# Patient Record
Sex: Female | Born: 1985 | Race: White | Hispanic: No | Marital: Single | Smoking: Former smoker
Health system: Southern US, Community
[De-identification: ages and names within clinical notes are randomized; demographics above are authoritative.]

## PROBLEM LIST (undated history)

## (undated) DIAGNOSIS — R42 Dizziness and giddiness: Secondary | ICD-10-CM

## (undated) DIAGNOSIS — E079 Disorder of thyroid, unspecified: Secondary | ICD-10-CM

## (undated) HISTORY — DX: Disorder of thyroid, unspecified: E07.9

## (undated) HISTORY — PX: LEG SURGERY: SHX1003

---

## 2006-07-02 ENCOUNTER — Encounter: Payer: Self-pay | Admitting: Maternal & Fetal Medicine

## 2006-11-11 ENCOUNTER — Observation Stay: Payer: Self-pay | Admitting: Obstetrics and Gynecology

## 2006-11-14 ENCOUNTER — Inpatient Hospital Stay: Payer: Self-pay | Admitting: Obstetrics and Gynecology

## 2008-09-18 ENCOUNTER — Emergency Department (HOSPITAL_COMMUNITY): Admission: EM | Admit: 2008-09-18 | Discharge: 2008-09-18 | Payer: Self-pay | Admitting: Family Medicine

## 2009-02-12 ENCOUNTER — Encounter: Admission: RE | Admit: 2009-02-12 | Discharge: 2009-03-12 | Payer: Self-pay | Admitting: Orthopedic Surgery

## 2011-04-10 ENCOUNTER — Encounter: Payer: Self-pay | Admitting: Maternal & Fetal Medicine

## 2011-09-17 ENCOUNTER — Inpatient Hospital Stay: Payer: Self-pay

## 2011-09-17 LAB — CBC WITH DIFFERENTIAL/PLATELET
Basophil %: 0.2 %
Eosinophil %: 0.3 %
HGB: 12.8 g/dL (ref 12.0–16.0)
MCV: 95 fL (ref 80–100)
Monocyte %: 9.2 %
Neutrophil %: 79 %
Platelet: 210 10*3/uL (ref 150–440)
RBC: 3.78 10*6/uL — ABNORMAL LOW (ref 3.80–5.20)

## 2011-09-18 LAB — HEMOGLOBIN: HGB: 9.4 g/dL — ABNORMAL LOW (ref 12.0–16.0)

## 2016-01-16 ENCOUNTER — Emergency Department
Admission: EM | Admit: 2016-01-16 | Discharge: 2016-01-16 | Disposition: A | Discharge status: Home / Self Care | Payer: Self-pay | Attending: Emergency Medicine | Admitting: Emergency Medicine

## 2016-01-16 ENCOUNTER — Encounter: Payer: Self-pay | Admitting: Emergency Medicine

## 2016-01-16 ENCOUNTER — Emergency Department: Payer: Self-pay

## 2016-01-16 DIAGNOSIS — R42 Dizziness and giddiness: Secondary | ICD-10-CM | POA: Insufficient documentation

## 2016-01-16 DIAGNOSIS — F17200 Nicotine dependence, unspecified, uncomplicated: Secondary | ICD-10-CM | POA: Insufficient documentation

## 2016-01-16 HISTORY — DX: Dizziness and giddiness: R42

## 2016-01-16 LAB — URINALYSIS, COMPLETE (UACMP) WITH MICROSCOPIC
BILIRUBIN URINE: NEGATIVE
GLUCOSE, UA: NEGATIVE mg/dL
HGB URINE DIPSTICK: NEGATIVE
KETONES UR: NEGATIVE mg/dL
LEUKOCYTES UA: NEGATIVE
NITRITE: NEGATIVE
PH: 8 (ref 5.0–8.0)
Protein, ur: NEGATIVE mg/dL
RBC / HPF: NONE SEEN RBC/hpf (ref 0–5)
SQUAMOUS EPITHELIAL / LPF: NONE SEEN
Specific Gravity, Urine: 1.004 — ABNORMAL LOW (ref 1.005–1.030)

## 2016-01-16 LAB — BASIC METABOLIC PANEL
Anion gap: 7 (ref 5–15)
BUN: 12 mg/dL (ref 6–20)
CALCIUM: 9.6 mg/dL (ref 8.9–10.3)
CO2: 26 mmol/L (ref 22–32)
CREATININE: 0.68 mg/dL (ref 0.44–1.00)
Chloride: 105 mmol/L (ref 101–111)
Glucose, Bld: 116 mg/dL — ABNORMAL HIGH (ref 65–99)
Potassium: 3.9 mmol/L (ref 3.5–5.1)
SODIUM: 138 mmol/L (ref 135–145)

## 2016-01-16 LAB — CBC
HCT: 42.9 % (ref 35.0–47.0)
Hemoglobin: 15.3 g/dL (ref 12.0–16.0)
MCH: 33.2 pg (ref 26.0–34.0)
MCHC: 35.6 g/dL (ref 32.0–36.0)
MCV: 93.4 fL (ref 80.0–100.0)
PLATELETS: 265 10*3/uL (ref 150–440)
RBC: 4.59 MIL/uL (ref 3.80–5.20)
RDW: 12.1 % (ref 11.5–14.5)
WBC: 5.8 10*3/uL (ref 3.6–11.0)

## 2016-01-16 LAB — POCT PREGNANCY, URINE: Preg Test, Ur: NEGATIVE

## 2016-01-16 MED ORDER — DIAZEPAM 2 MG PO TABS
2.0000 mg | ORAL_TABLET | Freq: Once | ORAL | Status: AC
  Administered 2016-01-16: 2 mg via ORAL
  Filled 2016-01-16: qty 1

## 2016-01-16 MED ORDER — PROMETHAZINE HCL 12.5 MG PO TABS: 12.5000 mg | ORAL_TABLET | Freq: Four times a day (QID) | ORAL | 0 refills | Status: DC | PRN

## 2016-01-16 MED ORDER — MECLIZINE HCL 25 MG PO TABS: 25.0000 mg | ORAL_TABLET | Freq: Three times a day (TID) | ORAL | 0 refills | Status: DC | PRN

## 2016-01-16 MED ORDER — MECLIZINE HCL 25 MG PO TABS
25.0000 mg | ORAL_TABLET | Freq: Once | ORAL | Status: AC
  Administered 2016-01-16: 25 mg via ORAL
  Filled 2016-01-16: qty 1

## 2016-01-16 MED ORDER — SODIUM CHLORIDE 0.9 % IV BOLUS (SEPSIS)
1000.0000 mL | Freq: Once | INTRAVENOUS | Status: AC
  Administered 2016-01-16: 1000 mL via INTRAVENOUS

## 2016-01-16 MED ORDER — PROMETHAZINE HCL 12.5 MG RE SUPP: 12.5000 mg | Freq: Four times a day (QID) | RECTAL | 0 refills | Status: DC | PRN

## 2016-01-16 NOTE — ED Notes (Signed)
Pt reports having a hx of vertigo that started last year, pt states she does a "manuever" that has helped the dizziness before however the maneuver has not helped this current time.  Pt states she is not sure what the name of the maneuver is. Pt states when she is still the symptoms minimize however any slight movement causes increased dizziness.

## 2016-01-16 NOTE — ED Triage Notes (Signed)
Pt presents with dizziness, nausea, headche for over 1 week. States she was diagnosed with vertigo over a year ago, but states this felt different. One day over this past weekend, she experienced tinnitus for an hour or so. Pt reports being ok while she is still, but any movement gives her dizziness, nausea, headache. Pt alert & oriented. NAD noted.

## 2016-01-16 NOTE — ED Provider Notes (Signed)
-----------------------------------------   5:37 PM on 01/16/2016 -----------------------------------------   Blood pressure 122/72, pulse 68, temperature 97.8 F (36.6 C), temperature source Oral, resp. rate 16, height 5\' 5"  (1.651 m), weight 153 lb (69.4 kg), SpO2 100 %.  Assuming care from Dr. Pershing ProudSchaevitz.  In short, Anne Murphy is a 30 y.o. female with a chief complaint of Dizziness .  Refer to the original H&P for additional details.  The current plan of care is to follow patient's results and check on her from a clinical state. She states she feels slightly symptomatically improved after receiving Antivert and diazepam. The patient however states this for chest vertigo episode for her seems to be persistent and associated with a headache and some ringing in her ears which may be a form of Mnire's disease. She has not had any previous imaging was discussed the pros and cons of having a head CT. Patient agrees to a CAT scan to rule out central vertigo.  "Ct Head Wo Contrast  Result Date: 01/16/2016 CLINICAL DATA:  Vertigo for 2 weeks. EXAM: CT HEAD WITHOUT CONTRAST TECHNIQUE: Contiguous axial images were obtained from the base of the skull through the vertex without intravenous contrast. COMPARISON:  None. FINDINGS: BRAIN: The ventricles and sulci are normal. No intraparenchymal hemorrhage, mass effect nor midline shift. No acute large vascular territory infarcts. Grey-white matter distinction is maintained. The basal ganglia are unremarkable. No abnormal extra-axial fluid collections. Basal cisterns are patent. The brainstem and cerebellar hemispheres are without acute abnormalities. VASCULAR: Unremarkable. SKULL/SOFT TISSUES: No skull fracture. No significant soft tissue swelling. ORBITS/SINUSES: The included ocular globes and orbital contents are normal.The mastoid air cells are clear. The included paranasal sinuses are well-aerated. OTHER: None. IMPRESSION: No acute intracranial abnormality.  Electronically Signed   By: Tollie Ethavid  Kwon M.D.   On: 01/16/2016 16:50  " CAT scan was negative and the patient felt comfortable with discharge home and was prescribed medications.  " New Prescriptions   MECLIZINE (ANTIVERT) 25 MG TABLET    Take 1 tablet (25 mg total) by mouth 3 (three) times daily as needed for dizziness or nausea.   PROMETHAZINE (PHENERGAN) 12.5 MG SUPPOSITORY    Place 1 suppository (12.5 mg total) rectally every 6 (six) hours as needed for nausea or vomiting.   PROMETHAZINE (PHENERGAN) 12.5 MG TABLET    Take 1 tablet (12.5 mg total) by mouth every 6 (six) hours as needed for nausea or vomiting.  " She was referred to neurology unassigned and advised to return here if she develops any other focal problems and was given a school note due to the vertigo.  Patient was advised to return immediately if condition worsens. Patient was advised to follow up with their primary care physician or other specialized physicians involved in their outpatient care. The patient and/or family member/power of attorney had laboratory results reviewed at the bedside. All questions and concerns were addressed and appropriate discharge instructions were distributed by the nursing staff.     Anne MoccasinBrian S Loudon Krakow, MD 01/16/16 1739

## 2016-01-16 NOTE — ED Notes (Signed)
Patient transported to CT 

## 2016-01-16 NOTE — Discharge Instructions (Signed)
Please return immediately if condition worsens. Please contact her primary physician or the physician you were given for referral. If you have any specialist physicians involved in her treatment and plan please also contact them. Thank you for using [facility]. ° °

## 2016-01-16 NOTE — ED Provider Notes (Signed)
[facility] Emergency Department Provider Note  ____________________________________________   None    (approximate)  I have reviewed the triage vital signs and the nursing notes.   HISTORY  Chief Complaint Dizziness   HPI Anne Murphy is a 30 y.o. female with a history of vertigo who is presenting to the emergency department today with one half weeks of intermittent vertigo. She says it is worse with movement and now cannot move her head without feeling like the world is moving around her. Says that she has had intermittent ringing in her ear over the past weeks to the right ear. Says that she also had a URI about one half weeks ago. Has had previous episodes of vertigo and has been able to do "maneuvers" at home that have corrected the symptoms. However, she has been unable to correct her symptoms at home this time around and therefore came to the emergency department today. Says that she is also had mild intermittent headaches but denies any headache at this time. Says that she has been nauseous but without any vomiting.   Past Medical History:  Diagnosis Date  . Vertigo     There are no active problems to display for this patient.   History reviewed. No pertinent surgical history.  Prior to Admission medications   Not on File    Allergies Patient has no known allergies.  History reviewed. No pertinent family history.  Social History Social History  Substance Use Topics  . Smoking status: Current Every Day Smoker    Packs/day: 0.50  . Smokeless tobacco: Never Used  . Alcohol use Not on file     Comment: occasional    Review of Systems Constitutional: No fever/chills Eyes: No visual changes. ENT: No sore throat. Cardiovascular: Denies chest pain. Respiratory: Denies shortness of breath. Gastrointestinal: No abdominal pain. no vomiting.  No diarrhea.  No constipation. Genitourinary: Negative for dysuria. Musculoskeletal: Negative  for back pain. Skin: Negative for rash. Neurological: Negative for headaches, focal weakness or numbness.  10-point ROS otherwise negative.  ____________________________________________   PHYSICAL EXAM:  VITAL SIGNS: ED Triage Vitals  Enc Vitals Group     BP 01/16/16 1112 (!) 149/85     Pulse Rate 01/16/16 1112 (!) 110     Resp 01/16/16 1112 20     Temp 01/16/16 1112 97.8 F (36.6 C)     Temp Source 01/16/16 1112 Oral     SpO2 01/16/16 1112 100 %     Weight 01/16/16 1113 153 lb (69.4 kg)     Height 01/16/16 1113 5\' 5"  (1.651 m)     Head Circumference --      Peak Flow --      Pain Score --      Pain Loc --      Pain Edu? --      Excl. in GC? --     Constitutional: Alert and oriented. Well appearing and in no acute distress. Eyes: Conjunctivae are normal. PERRL. EOMI. Head: Atraumatic.Normal TMs bilaterally. Nose: No congestion/rhinnorhea. Mouth/Throat: Mucous membranes are moist.   Neck: No stridor.   Cardiovascular: Normal rate, regular rhythm. Grossly normal heart sounds.   Respiratory: Normal respiratory effort.  No retractions. Lungs CTAB. Gastrointestinal: Soft and nontender. No distention. No CVA tenderness. Musculoskeletal: No lower extremity tenderness nor edema.  No joint effusions. Neurologic:  Normal speech and language. No gross focal neurologic deficits are appreciated. No nystagmus. No ataxia on finger to nose or heel to shin testing.  Skin:  Skin is warm, dry and intact. No rash noted. Psychiatric: Mood and affect are normal. Speech and behavior are normal.  ____________________________________________   LABS (all labs ordered are listed, but only abnormal results are displayed)  Labs Reviewed  BASIC METABOLIC PANEL - Abnormal; Notable for the following:       Result Value   Glucose, Bld 116 (*)    All other components within normal limits  URINALYSIS, COMPLETE (UACMP) WITH MICROSCOPIC - Abnormal; Notable for the following:    Color, Urine STRAW  (*)    APPearance CLEAR (*)    Specific Gravity, Urine 1.004 (*)    Bacteria, UA RARE (*)    All other components within normal limits  CBC  POC URINE PREG, ED  POCT PREGNANCY, URINE  CBG MONITORING, ED   ____________________________________________  EKG   ____________________________________________  RADIOLOGY   ____________________________________________   PROCEDURES  Procedure(s) performed:   Procedures  Critical Care performed:   ____________________________________________   INITIAL IMPRESSION / ASSESSMENT AND PLAN / ED COURSE  Pertinent labs & imaging results that were available during my care of the patient were reviewed by me and considered in my medical decision making (see chart for details).   Clinical Course   ----------------------------------------- 4:15 PM on 01/16/2016 -----------------------------------------  Patient was reassessed and with mild improvement after meclizine. Decision made to start with IV fluids as well as Valium. She says that her nausea was relieved with the meclizine but still with sensation of movement of the room when she moves her head. Signed out to Dr. Huel CoteQuigley.   ____________________________________________   FINAL CLINICAL IMPRESSION(S) / ED DIAGNOSES  Vertigo.    NEW MEDICATIONS STARTED DURING THIS VISIT:  New Prescriptions   No medications on file     Note:  This document was prepared using Dragon voice recognition software and may include unintentional dictation errors.    Myrna Blazeravid Matthew Kawehi Hostetter, MD 01/16/16 [phone]

## 2018-04-20 ENCOUNTER — Other Ambulatory Visit (HOSPITAL_COMMUNITY)
Admission: RE | Admit: 2018-04-20 | Discharge: 2018-04-20 | Disposition: A | Discharge status: Home / Self Care | Payer: Medicaid Other | Source: Ambulatory Visit | Attending: Certified Nurse Midwife | Admitting: Certified Nurse Midwife

## 2018-04-20 ENCOUNTER — Encounter: Payer: Self-pay | Admitting: Certified Nurse Midwife

## 2018-04-20 ENCOUNTER — Ambulatory Visit (INDEPENDENT_AMBULATORY_CARE_PROVIDER_SITE_OTHER): Payer: Medicaid Other | Admitting: Certified Nurse Midwife

## 2018-04-20 VITALS — BP 130/71 | HR 95 | Ht 65.0 in | Wt 166.4 lb

## 2018-04-20 DIAGNOSIS — Z124 Encounter for screening for malignant neoplasm of cervix: Secondary | ICD-10-CM

## 2018-04-20 DIAGNOSIS — Z01419 Encounter for gynecological examination (general) (routine) without abnormal findings: Secondary | ICD-10-CM | POA: Diagnosis not present

## 2018-04-20 DIAGNOSIS — Z30431 Encounter for routine checking of intrauterine contraceptive device: Secondary | ICD-10-CM | POA: Diagnosis not present

## 2018-04-20 DIAGNOSIS — Z6827 Body mass index (BMI) 27.0-27.9, adult: Secondary | ICD-10-CM | POA: Diagnosis not present

## 2018-04-20 DIAGNOSIS — Z7689 Persons encountering health services in other specified circumstances: Secondary | ICD-10-CM

## 2018-04-20 DIAGNOSIS — Z0000 Encounter for general adult medical examination without abnormal findings: Secondary | ICD-10-CM

## 2018-04-20 DIAGNOSIS — F17200 Nicotine dependence, unspecified, uncomplicated: Secondary | ICD-10-CM

## 2018-04-20 DIAGNOSIS — Z975 Presence of (intrauterine) contraceptive device: Secondary | ICD-10-CM

## 2018-04-20 NOTE — Progress Notes (Signed)
ANNUAL PREVENTATIVE CARE GYN  ENCOUNTER NOTE  Subjective:       Anne Murphy is a 33 y.o. G28P2002 female here for a routine annual gynecologic exam.   Needs Pap smear and IUD string check. Denies difficulty breathing or respiratory distress, chest pain, abdominal pain, vaginal bleeding, dysuria, and leg pain or swelling.    Gynecologic History  No LMP recorded. (Menstrual status: IUD).  Contraception: IUD, Mirena  Last Pap: 2017. Results were: normal  Obstetric History  OB History  Gravida Para Term Preterm AB Living  2 2 2     2   SAB TAB Ectopic Multiple Live Births          2    # Outcome Date GA Lbr Len/2nd Weight Sex Delivery Anes PTL Lv  2 Term 2013   6 lb 4 oz (2.835 kg) M Vag-Spont   LIV  1 Term 2008   6 lb 6 oz (2.892 kg) M Vag-Spont   LIV    Past Medical History:  Diagnosis Date  . Vertigo     Past Surgical History:  Procedure Laterality Date  . LEG SURGERY      Current Outpatient Medications on File Prior to Visit  Medication Sig Dispense Refill  . levonorgestrel (MIRENA) 20 MCG/24HR IUD by Intrauterine route.    . Multiple Vitamin (MULTIVITAMIN) capsule Take 1 capsule by mouth daily.     No current facility-administered medications on file prior to visit.     No Known Allergies  Social History   Socioeconomic History  . Marital status: Single    Spouse name: Not on file  . Number of children: Not on file  . Years of education: Not on file  . Highest education level: Not on file  Occupational History  . Not on file  Social Needs  . Financial resource strain: Not on file  . Food insecurity:    Worry: Not on file    Inability: Not on file  . Transportation needs:    Medical: Not on file    Non-medical: Not on file  Tobacco Use  . Smoking status: Current Every Day Smoker    Packs/day: 0.50  . Smokeless tobacco: Never Used  Substance and Sexual Activity  . Alcohol use: Yes    Comment: occasional  . Drug use: No  . Sexual activity:  Yes    Birth control/protection: I.U.D.  Lifestyle  . Physical activity:    Days per week: Not on file    Minutes per session: Not on file  . Stress: Not on file  Relationships  . Social connections:    Talks on phone: Not on file    Gets together: Not on file    Attends religious service: Not on file    Active member of club or organization: Not on file    Attends meetings of clubs or organizations: Not on file    Relationship status: Not on file  . Intimate partner violence:    Fear of current or ex partner: Not on file    Emotionally abused: Not on file    Physically abused: Not on file    Forced sexual activity: Not on file  Other Topics Concern  . Not on file  Social History Narrative  . Not on file    Family History  Problem Relation Age of Onset  . Stroke Maternal Grandmother     The following portions of the patient's history were reviewed and updated as appropriate: allergies, current medications,  past family history, past medical history, past social history, past surgical history and problem list.  Review of Systems  ROS negative except as noted above. Information obtained from patient.    Objective:   BP 130/71   Pulse 95   Ht 5\' 5"  (1.651 m)   Wt 166 lb 6 oz (75.5 kg)   BMI 27.69 kg/m    CONSTITUTIONAL: Well-developed, well-nourished female in no acute distress.   PSYCHIATRIC: Normal mood and affect. Normal behavior. Normal judgment and thought content.  NEUROLGIC: Alert and oriented to person, place, and time. Normal muscle tone coordination. No cranial nerve deficit noted.  HENT:  Normocephalic, atraumatic, External right and left ear normal.   EYES: Conjunctivae and EOM are normal. Pupils are equal and round.  NECK: Normal range of motion, supple, no masses.  Normal thyroid.   SKIN: Skin is warm and dry. No rash noted. Not diaphoretic. No erythema. No pallor.  CARDIOVASCULAR: Normal heart rate noted, regular rhythm, no  murmur.  RESPIRATORY: Clear to auscultation bilaterally. Effort and breath sounds normal, no problems with respiration noted.  BREASTS: Symmetric in size. No masses, skin changes, nipple drainage, or lymphadenopathy.  ABDOMEN: Soft, normal bowel sounds, no distention noted.  No tenderness, rebound or guarding.   PELVIC:  External Genitalia: Normal  Vagina: Normal  Cervix: Normal, IUD strings present, Pap collected  Uterus: Normal  Adnexa: Normal  MUSCULOSKELETAL: Normal range of motion. No tenderness.  No cyanosis, clubbing, or edema.  2+ distal pulses.  LYMPHATIC: No Axillary, Supraclavicular, or Inguinal Adenopathy.  Assessment:   Annual gynecologic examination 33 y.o.   Contraception: IUD, Mirena   Overweight   Problem List Items Addressed This Visit    None    Visit Diagnoses    Well woman exam    -  Primary   Relevant Orders   CBC   Comprehensive metabolic panel   Lipid panel   Cytology - PAP   Screening for cervical cancer       Relevant Orders   Cytology - PAP   IUD check up       BMI 27.0-27.9,adult       Relevant Orders   Lipid panel   Encounter to establish care       Relevant Orders   Ambulatory referral to Family Practice   Smoker          Plan:   Pap: Pap Co Test, see orders  Labs: See orders, patient will return for fasting labs   Routine preventative health maintenance measures emphasized: Exercise/Diet/Weight control, Tobacco Warnings, Alcohol/Substance use risks and Stress Management; see AVS  Referral to [facility], see orders  Reviewed red flag symptoms and when to call  RTC x 1 year for ANNUAL EXAM or sooner if needed   Gunnar Bulla, CNM [facility], [facility] 04/20/18 11:50 AM

## 2018-04-20 NOTE — Patient Instructions (Addendum)
WE WOULD LOVE TO HEAR FROM YOU!!!!   Thank you Anne Murphy for visiting [facility].  Providing our patients with the best experience possible is really important to [place], and we hope that you felt that on your recent visit. The most valuable feedback we get comes from [place]!!    If you receive a survey please take a couple of minutes to let us know how we did.Thank you for continuing to trust [place] with your care.   [facility]   Levonorgestrel intrauterine device (IUD) What is this medicine? LEVONORGESTREL IUD (LEE voe nor jes trel) is a contraceptive (birth control) device. The device is placed inside the uterus by a healthcare professional. It is used to prevent pregnancy. This device can also be used to treat heavy bleeding that occurs during your period. This medicine may be used for other purposes; ask your health care provider or pharmacist if you have questions. COMMON BRAND NAME(S): Minette Headland What should I tell my health care provider before I take this medicine? They need to know if you have any of these conditions: -abnormal Pap smear -cancer of the breast, uterus, or cervix -diabetes -endometritis -genital or pelvic infection now or in the past -have more than one sexual partner or your partner has more than one partner -heart disease -history of an ectopic or tubal pregnancy -immune system problems -IUD in place -liver disease or tumor -problems with blood clots or take blood-thinners -seizures -use intravenous drugs -uterus of unusual shape -vaginal bleeding that has not been explained -an unusual or allergic reaction to levonorgestrel, other hormones, silicone, or polyethylene, medicines, foods, dyes, or preservatives -pregnant or trying to get pregnant -breast-feeding How should I use this medicine? This device is placed inside the uterus by a health care professional. Talk to your pediatrician  regarding the use of this medicine in children. Special care may be needed. Overdosage: If you think you have taken too much of this medicine contact a poison control center or emergency room at once. NOTE: This medicine is only for you. Do not share this medicine with others. What if I miss a dose? This does not apply. Depending on the brand of device you have inserted, the device will need to be replaced every 3 to 5 years if you wish to continue using this type of birth control. What may interact with this medicine? Do not take this medicine with any of the following medications: -amprenavir -bosentan -fosamprenavir This medicine may also interact with the following medications: -aprepitant -armodafinil -barbiturate medicines for inducing sleep or treating seizures -bexarotene -boceprevir -griseofulvin -medicines to treat seizures like carbamazepine, ethotoin, felbamate, oxcarbazepine, phenytoin, topiramate -modafinil -pioglitazone -rifabutin -rifampin -rifapentine -some medicines to treat HIV infection like atazanavir, efavirenz, indinavir, lopinavir, nelfinavir, tipranavir, ritonavir -St. John's wort -warfarin This list may not describe all possible interactions. Give your health care provider a list of all the medicines, herbs, non-prescription drugs, or dietary supplements you use. Also tell them if you smoke, drink alcohol, or use illegal drugs. Some items may interact with your medicine. What should I watch for while using this medicine? Visit your doctor or health care professional for regular check ups. See your doctor if you or your partner has sexual contact with others, becomes HIV positive, or gets a sexual transmitted disease. This product does not protect you against HIV infection (AIDS) or other sexually transmitted diseases. You can check the placement of the IUD yourself by reaching up to the top  of your vagina with clean fingers to feel the threads. Do not pull on  the threads. It is a good habit to check placement after each menstrual period. Call your doctor right away if you feel more of the IUD than just the threads or if you cannot feel the threads at all. The IUD may come out by itself. You may become pregnant if the device comes out. If you notice that the IUD has come out use a backup birth control method like condoms and call your health care provider. Using tampons will not change the position of the IUD and are okay to use during your period. This IUD can be safely scanned with magnetic resonance imaging (MRI) only under specific conditions. Before you have an MRI, tell your healthcare provider that you have an IUD in place, and which type of IUD you have in place. What side effects may I notice from receiving this medicine? Side effects that you should report to your doctor or health care professional as soon as possible: -allergic reactions like skin rash, itching or hives, swelling of the face, lips, or tongue -fever, flu-like symptoms -genital sores -high blood pressure -no menstrual period for 6 weeks during use -pain, swelling, warmth in the leg -pelvic pain or tenderness -severe or sudden headache -signs of pregnancy -stomach cramping -sudden shortness of breath -trouble with balance, talking, or walking -unusual vaginal bleeding, discharge -yellowing of the eyes or skin Side effects that usually do not require medical attention (report to your doctor or health care professional if they continue or are bothersome): -acne -breast pain -change in sex drive or performance -changes in weight -cramping, dizziness, or faintness while the device is being inserted -headache -irregular menstrual bleeding within first 3 to 6 months of use -nausea This list may not describe all possible side effects. Call your doctor for medical advice about side effects. You may report side effects to FDA at 1-800-FDA-1088. Where should I keep my  medicine? This does not apply. NOTE: This sheet is a summary. It may not cover all possible information. If you have questions about this medicine, talk to your doctor, pharmacist, or health care provider.  2019 Elsevier/Gold Standard (2015-11-09 14:14:56)   Preventive Care 18-39 Years, Female Preventive care refers to lifestyle choices and visits with your health care provider that can promote health and wellness. What does preventive care include?   A yearly physical exam. This is also called an annual well check.  Dental exams once or twice a year.  Routine eye exams. Ask your health care provider how often you should have your eyes checked.  Personal lifestyle choices, including: ? Daily care of your teeth and gums. ? Regular physical activity. ? Eating a healthy diet. ? Avoiding tobacco and drug use. ? Limiting alcohol use. ? Practicing safe sex. ? Taking vitamin and mineral supplements as recommended by your health care provider. What happens during an annual well check? The services and screenings done by your health care provider during your annual well check will depend on your age, overall health, lifestyle risk factors, and family history of disease. Counseling Your health care provider may ask you questions about your:  Alcohol use.  Tobacco use.  Drug use.  Emotional well-being.  Home and relationship well-being.  Sexual activity.  Eating habits.  Work and work Statistician.  Method of birth control.  Menstrual cycle.  Pregnancy history. Screening You may have the following tests or measurements:  Height, weight, and BMI.  Diabetes screening. This is done by checking your blood sugar (glucose) after you have not eaten for a while (fasting).  Blood pressure.  Lipid and cholesterol levels. These may be checked every 5 years starting at age 107.  Skin check.  Hepatitis C blood test.  Hepatitis B blood test.  Sexually transmitted disease  (STD) testing.  BRCA-related cancer screening. This may be done if you have a family history of breast, ovarian, tubal, or peritoneal cancers.  Pelvic exam and Pap test. This may be done every 3 years starting at age 43. Starting at age 66, this may be done every 5 years if you have a Pap test in combination with an HPV test. Discuss your test results, treatment options, and if necessary, the need for more tests with your health care provider. Vaccines Your health care provider may recommend certain vaccines, such as:  Influenza vaccine. This is recommended every year.  Tetanus, diphtheria, and acellular pertussis (Tdap, Td) vaccine. You may need a Td booster every 10 years.  Varicella vaccine. You may need this if you have not been vaccinated.  HPV vaccine. If you are 76 or younger, you may need three doses over 6 months.  Measles, mumps, and rubella (MMR) vaccine. You may need at least one dose of MMR. You may also need a second dose.  Pneumococcal 13-valent conjugate (PCV13) vaccine. You may need this if you have certain conditions and were not previously vaccinated.  Pneumococcal polysaccharide (PPSV23) vaccine. You may need one or two doses if you smoke cigarettes or if you have certain conditions.  Meningococcal vaccine. One dose is recommended if you are age 72-21 years and a first-year college student living in a residence hall, or if you have one of several medical conditions. You may also need additional booster doses.  Hepatitis A vaccine. You may need this if you have certain conditions or if you travel or work in places where you may be exposed to hepatitis A.  Hepatitis B vaccine. You may need this if you have certain conditions or if you travel or work in places where you may be exposed to hepatitis B.  Haemophilus influenzae type b (Hib) vaccine. You may need this if you have certain risk factors. Talk to your health care provider about which screenings and vaccines you  need and how often you need them. This information is not intended to replace advice given to you by your health care provider. Make sure you discuss any questions you have with your health care provider. Document Released: 03/25/2001 Document Revised: 09/09/2016 Document Reviewed: 11/28/2014 Elsevier Interactive Patient Education  2019 [employer].

## 2018-04-21 ENCOUNTER — Other Ambulatory Visit: Payer: Medicaid Other

## 2018-04-21 ENCOUNTER — Other Ambulatory Visit: Payer: Self-pay

## 2018-04-21 DIAGNOSIS — Z01419 Encounter for gynecological examination (general) (routine) without abnormal findings: Secondary | ICD-10-CM | POA: Diagnosis not present

## 2018-04-21 DIAGNOSIS — Z6827 Body mass index (BMI) 27.0-27.9, adult: Secondary | ICD-10-CM | POA: Diagnosis not present

## 2018-04-22 LAB — COMPREHENSIVE METABOLIC PANEL
A/G RATIO: 2.5 — AB (ref 1.2–2.2)
ALK PHOS: 49 IU/L (ref 39–117)
ALT: 14 IU/L (ref 0–32)
AST: 13 IU/L (ref 0–40)
Albumin: 4.5 g/dL (ref 3.8–4.8)
BUN/Creatinine Ratio: 14 (ref 9–23)
BUN: 9 mg/dL (ref 6–20)
Bilirubin Total: 0.3 mg/dL (ref 0.0–1.2)
CO2: 22 mmol/L (ref 20–29)
CREATININE: 0.66 mg/dL (ref 0.57–1.00)
Calcium: 9.3 mg/dL (ref 8.7–10.2)
Chloride: 105 mmol/L (ref 96–106)
GFR calc Af Amer: 135 mL/min/{1.73_m2} (ref >59)
GFR calc non Af Amer: 117 mL/min/{1.73_m2} (ref >59)
GLOBULIN, TOTAL: 1.8 g/dL (ref 1.5–4.5)
Glucose: 87 mg/dL (ref 65–99)
POTASSIUM: 4.3 mmol/L (ref 3.5–5.2)
SODIUM: 140 mmol/L (ref 134–144)
Total Protein: 6.3 g/dL (ref 6.0–8.5)

## 2018-04-22 LAB — CBC
HEMATOCRIT: 38.4 % (ref 34.0–46.6)
Hemoglobin: 13.5 g/dL (ref 11.1–15.9)
MCH: 32.7 pg (ref 26.6–33.0)
MCHC: 35.2 g/dL (ref 31.5–35.7)
MCV: 93 fL (ref 79–97)
Platelets: 274 10*3/uL (ref 150–450)
RBC: 4.13 x10E6/uL (ref 3.77–5.28)
RDW: 11.6 % — AB (ref 11.7–15.4)
WBC: 5.1 10*3/uL (ref 3.4–10.8)

## 2018-04-22 LAB — LIPID PANEL
CHOLESTEROL TOTAL: 119 mg/dL (ref 100–199)
Chol/HDL Ratio: 2.2 ratio (ref 0.0–4.4)
HDL: 54 mg/dL (ref >39)
LDL Calculated: 58 mg/dL (ref 0–99)
TRIGLYCERIDES: 34 mg/dL (ref 0–149)
VLDL Cholesterol Cal: 7 mg/dL (ref 5–40)

## 2018-04-23 LAB — CYTOLOGY - PAP
Diagnosis: NEGATIVE
HPV: NOT DETECTED

## 2018-05-18 IMAGING — CT CT HEAD W/O CM
3 series · 16 of 46 positions shown, 19 images · non-contrast
Comparison: None.

CLINICAL DATA: Vertigo for 2 weeks.

EXAM:
CT HEAD WITHOUT CONTRAST
TECHNIQUE: Contiguous axial images were obtained from the base of the skull
through the vertex without intravenous contrast.

[Series 2: head wo · axial · 0.40mm/px · z∈[-271,-151]mm · 10 of 29 slices shown, 13 images]
[im 3/29  brain]
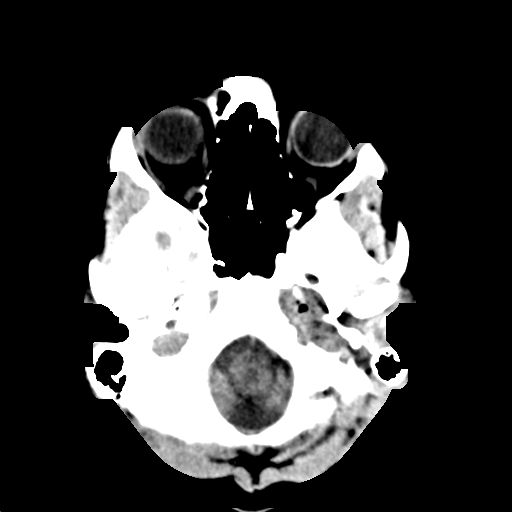
[im 3/29  bone]
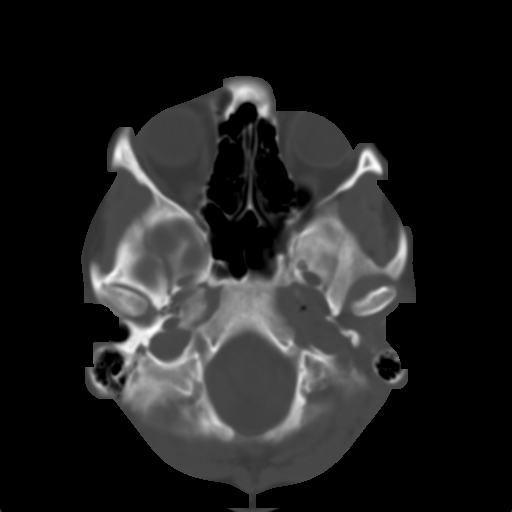
[im 6/29  brain]
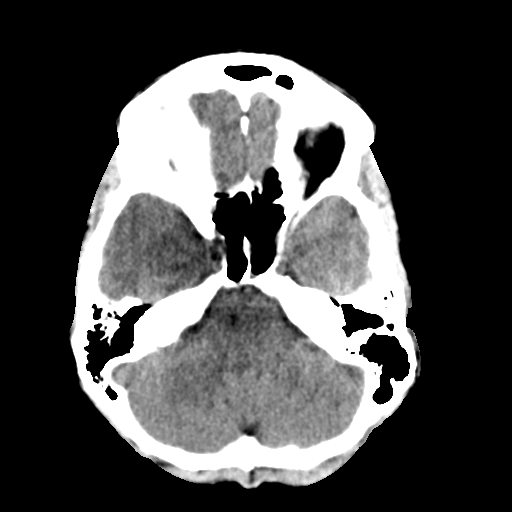
[im 8/29  brain]
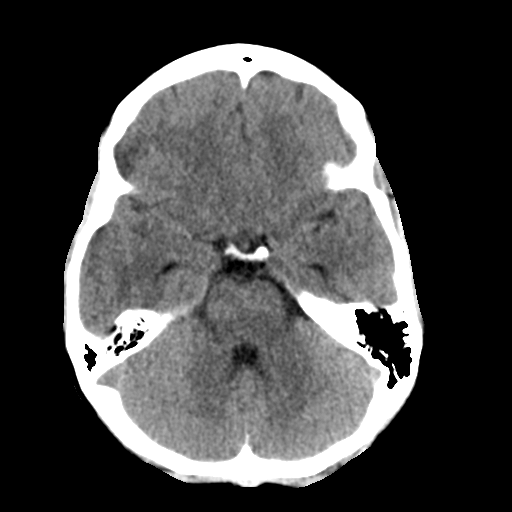
[im 11/29  brain]
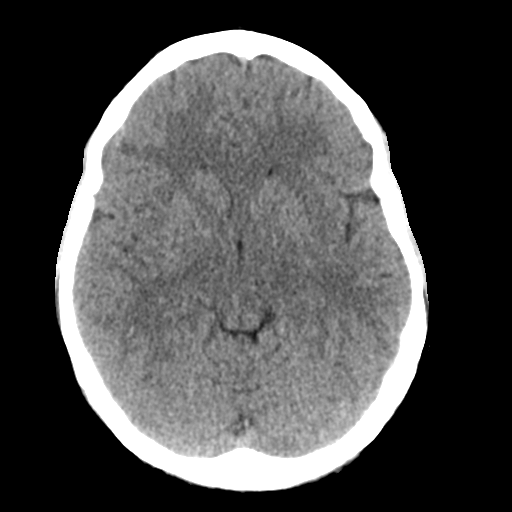
[im 14/29  brain]
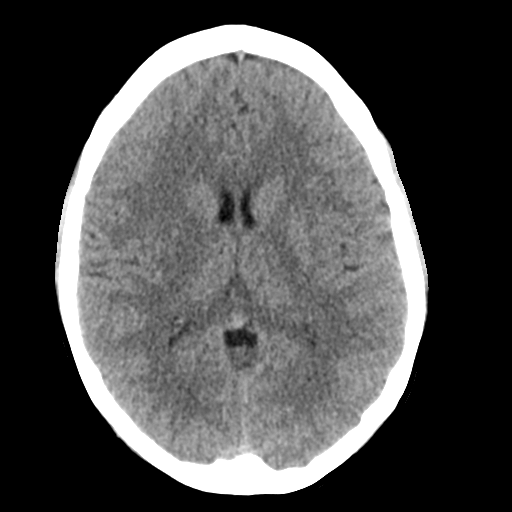
[im 14/29  bone]
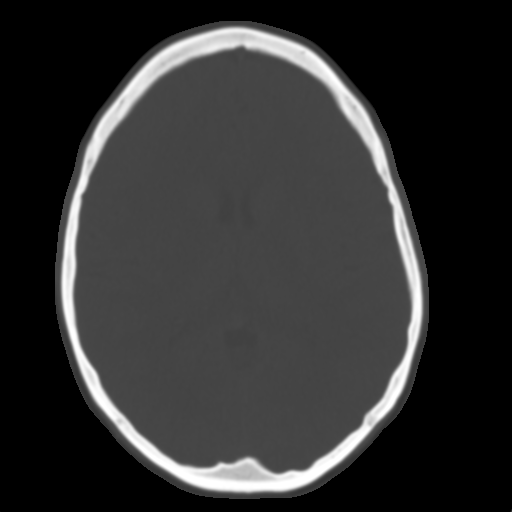
[im 16/29  brain]
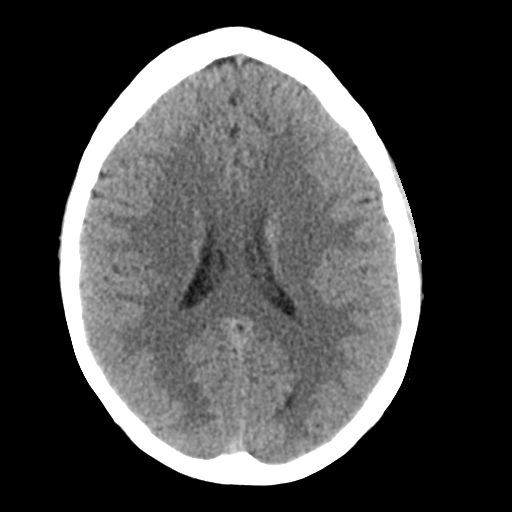
[im 19/29  brain]
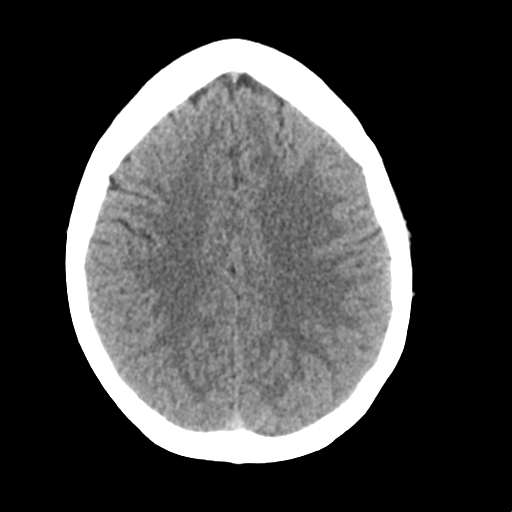
[im 22/29  brain]
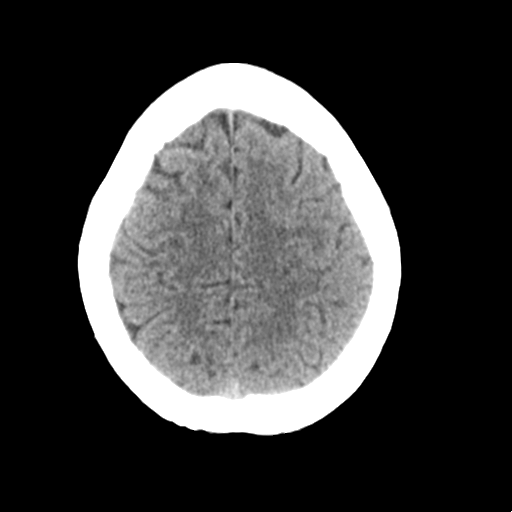
[im 24/29  brain]
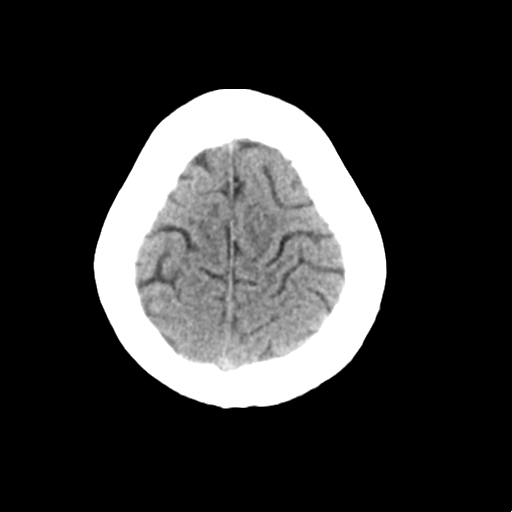
[im 24/29  bone]
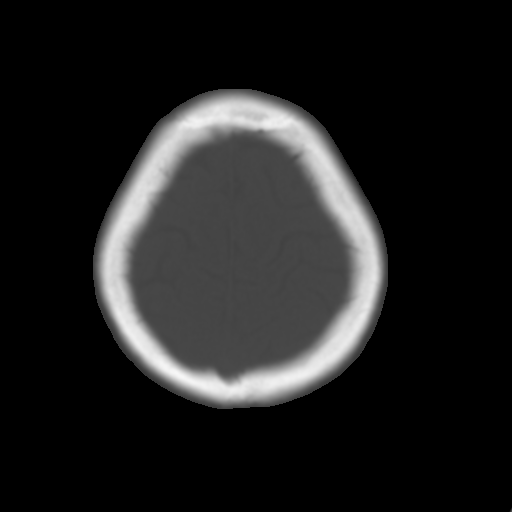
[im 27/29  brain]
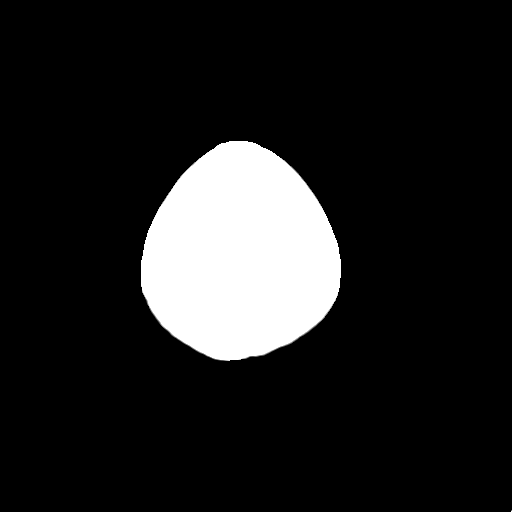

[Series 4: coronal soft tissue · coronal · 0.30mm/px · 3 of 64 slices shown]
[im 22/64  brain]
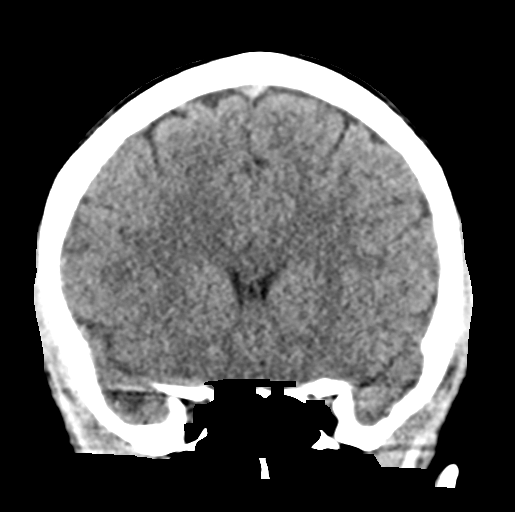
[im 29/64  brain]
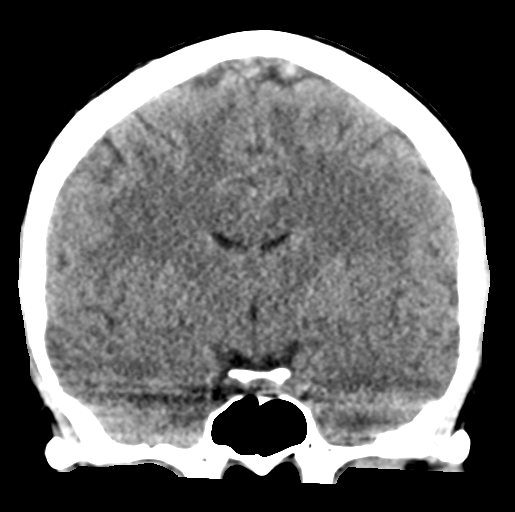
[im 36/64  brain]
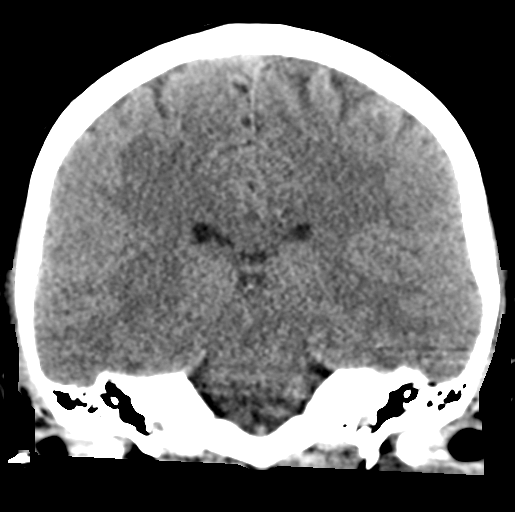

[Series 5: sagittal soft tissue · sagittal · 0.30mm/px · 3 of 52 slices shown]
[im 18/52  brain]
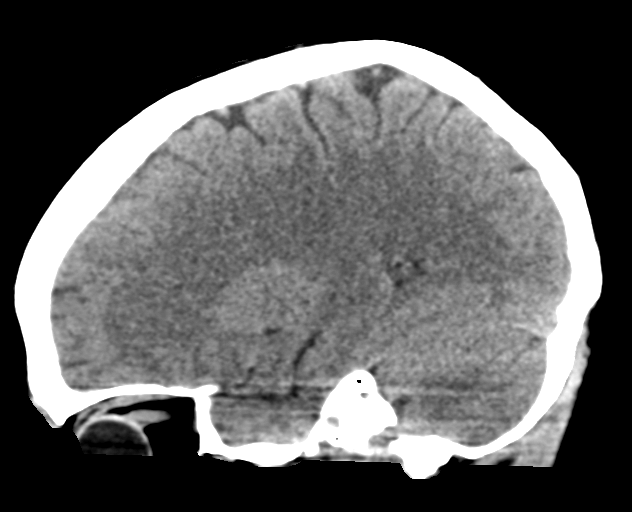
[im 26/52  brain]
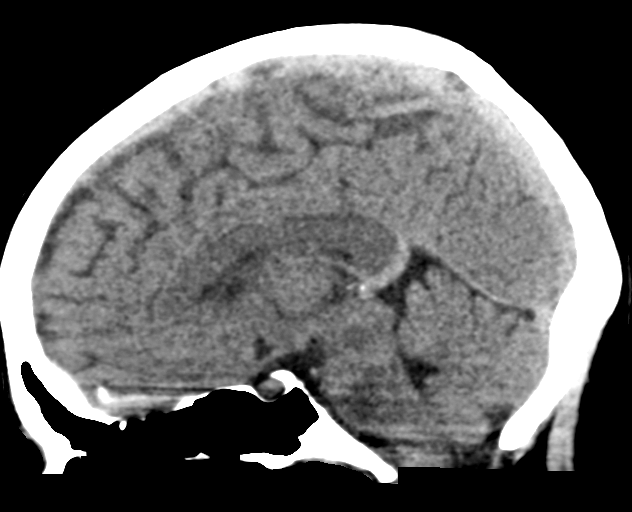
[im 35/52  brain]
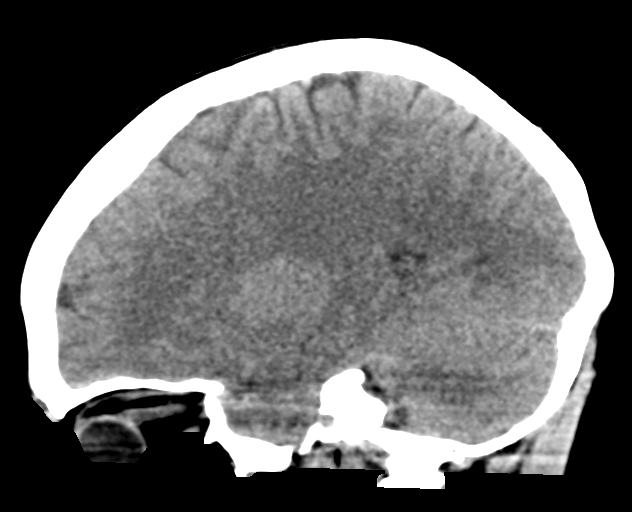

[16 of 46 positions shown; findings below may reference images not displayed]

FINDINGS: BRAIN: The ventricles and sulci are normal. No intraparenchymal
hemorrhage, mass effect nor midline shift. No acute large vascular
territory infarcts. Grey-white matter distinction is maintained. The
basal ganglia are unremarkable. No abnormal extra-axial fluid
collections. Basal cisterns are patent. The brainstem and cerebellar
hemispheres are without acute abnormalities.

VASCULAR: Unremarkable.

SKULL/SOFT TISSUES: No skull fracture. No significant soft tissue
swelling.

ORBITS/SINUSES: The included ocular globes and orbital contents are
normal.The mastoid air cells are clear. The included paranasal
sinuses are well-aerated.

OTHER: None.
IMPRESSION: No acute intracranial abnormality.

## 2018-12-09 DIAGNOSIS — Z23 Encounter for immunization: Secondary | ICD-10-CM | POA: Diagnosis not present

## 2019-04-04 DIAGNOSIS — Z20828 Contact with and (suspected) exposure to other viral communicable diseases: Secondary | ICD-10-CM | POA: Diagnosis not present

## 2019-04-14 ENCOUNTER — Encounter: Payer: Self-pay | Admitting: Physician Assistant

## 2019-04-14 ENCOUNTER — Ambulatory Visit (INDEPENDENT_AMBULATORY_CARE_PROVIDER_SITE_OTHER): Payer: Medicaid Other | Admitting: Physician Assistant

## 2019-04-14 VITALS — BP 129/88 | HR 92 | Temp 97.3°F | Ht 66.0 in | Wt 175.0 lb

## 2019-04-14 DIAGNOSIS — Z6828 Body mass index (BMI) 28.0-28.9, adult: Secondary | ICD-10-CM | POA: Diagnosis not present

## 2019-04-14 DIAGNOSIS — K5904 Chronic idiopathic constipation: Secondary | ICD-10-CM

## 2019-04-14 DIAGNOSIS — Z13220 Encounter for screening for lipoid disorders: Secondary | ICD-10-CM

## 2019-04-14 DIAGNOSIS — N898 Other specified noninflammatory disorders of vagina: Secondary | ICD-10-CM

## 2019-04-14 DIAGNOSIS — R635 Abnormal weight gain: Secondary | ICD-10-CM

## 2019-04-14 DIAGNOSIS — G44209 Tension-type headache, unspecified, not intractable: Secondary | ICD-10-CM

## 2019-04-14 DIAGNOSIS — Z131 Encounter for screening for diabetes mellitus: Secondary | ICD-10-CM

## 2019-04-14 DIAGNOSIS — Z0000 Encounter for general adult medical examination without abnormal findings: Secondary | ICD-10-CM

## 2019-04-14 NOTE — Patient Instructions (Signed)
Health Maintenance, Female Adopting a healthy lifestyle and getting preventive care are important in promoting health and wellness. Ask your health care provider about:  The right schedule for you to have regular tests and exams.  Things you can do on your own to prevent diseases and keep yourself healthy. What should I know about diet, weight, and exercise? Eat a healthy diet   Eat a diet that includes plenty of vegetables, fruits, low-fat dairy products, and lean protein.  Do not eat a lot of foods that are high in solid fats, added sugars, or sodium. Maintain a healthy weight Body mass index (BMI) is used to identify weight problems. It estimates body fat based on height and weight. Your health care provider can help determine your BMI and help you achieve or maintain a healthy weight. Get regular exercise Get regular exercise. This is one of the most important things you can do for your health. Most adults should:  Exercise for at least 150 minutes each week. The exercise should increase your heart rate and make you sweat (moderate-intensity exercise).  Do strengthening exercises at least twice a week. This is in addition to the moderate-intensity exercise.  Spend less time sitting. Even light physical activity can be beneficial. Watch cholesterol and blood lipids Have your blood tested for lipids and cholesterol at 34 years of age, then have this test every 5 years. Have your cholesterol levels checked more often if:  Your lipid or cholesterol levels are high.  You are older than 34 years of age.  You are at high risk for heart disease. What should I know about cancer screening? Depending on your health history and family history, you may need to have cancer screening at various ages. This may include screening for:  Breast cancer.  Cervical cancer.  Colorectal cancer.  Skin cancer.  Lung cancer. What should I know about heart disease, diabetes, and high blood  pressure? Blood pressure and heart disease  High blood pressure causes heart disease and increases the risk of stroke. This is more likely to develop in people who have high blood pressure readings, are of African descent, or are overweight.  Have your blood pressure checked: ? Every 3-5 years if you are 18-39 years of age. ? Every year if you are 40 years old or older. Diabetes Have regular diabetes screenings. This checks your fasting blood sugar level. Have the screening done:  Once every three years after age 40 if you are at a normal weight and have a low risk for diabetes.  More often and at a younger age if you are overweight or have a high risk for diabetes. What should I know about preventing infection? Hepatitis B If you have a higher risk for hepatitis B, you should be screened for this virus. Talk with your health care provider to find out if you are at risk for hepatitis B infection. Hepatitis C Testing is recommended for:  Everyone born from 1945 through 1965.  Anyone with known risk factors for hepatitis C. Sexually transmitted infections (STIs)  Get screened for STIs, including gonorrhea and chlamydia, if: ? You are sexually active and are younger than 34 years of age. ? You are older than 34 years of age and your health care provider tells you that you are at risk for this type of infection. ? Your sexual activity has changed since you were last screened, and you are at increased risk for chlamydia or gonorrhea. Ask your health care provider if   you are at risk.  Ask your health care provider about whether you are at high risk for HIV. Your health care provider may recommend a prescription medicine to help prevent HIV infection. If you choose to take medicine to prevent HIV, you should first get tested for HIV. You should then be tested every 3 months for as long as you are taking the medicine. Pregnancy  If you are about to stop having your period (premenopausal) and  you may become pregnant, seek counseling before you get pregnant.  Take 400 to 800 micrograms (mcg) of folic acid every day if you become pregnant.  Ask for birth control (contraception) if you want to prevent pregnancy. Osteoporosis and menopause Osteoporosis is a disease in which the bones lose minerals and strength with aging. This can result in bone fractures. If you are 65 years old or older, or if you are at risk for osteoporosis and fractures, ask your health care provider if you should:  Be screened for bone loss.  Take a calcium or vitamin D supplement to lower your risk of fractures.  Be given hormone replacement therapy (HRT) to treat symptoms of menopause. Follow these instructions at home: Lifestyle  Do not use any products that contain nicotine or tobacco, such as cigarettes, e-cigarettes, and chewing tobacco. If you need help quitting, ask your health care provider.  Do not use street drugs.  Do not share needles.  Ask your health care provider for help if you need support or information about quitting drugs. Alcohol use  Do not drink alcohol if: ? Your health care provider tells you not to drink. ? You are pregnant, may be pregnant, or are planning to become pregnant.  If you drink alcohol: ? Limit how much you use to 0-1 drink a day. ? Limit intake if you are breastfeeding.  Be aware of how much alcohol is in your drink. In the [place] one drink equals one 12 oz bottle of beer (355 mL), one 5 oz glass of wine (148 mL), or one 1 oz glass of hard liquor (44 mL). General instructions  Schedule regular health, dental, and eye exams.  Stay current with your vaccines.  Tell your health care provider if: ? You often feel depressed. ? You have ever been abused or do not feel safe at home. Summary  Adopting a healthy lifestyle and getting preventive care are important in promoting health and wellness.  Follow your health care provider's instructions about healthy  diet, exercising, and getting tested or screened for diseases.  Follow your health care provider's instructions on monitoring your cholesterol and blood pressure. This information is not intended to replace advice given to you by your health care provider. Make sure you discuss any questions you have with your health care provider. Document Revised: 01/20/2018 Document Reviewed: 01/20/2018 Elsevier Patient Education  2020 [employer]  

## 2019-04-14 NOTE — Progress Notes (Signed)
Patient: Anne Murphy, Female    DOB: [birth year], 34 y.o.   MRN: [id number] Visit Date: 04/14/2019  Today's Provider: Mar Daring, PA-C   Chief Complaint  Patient presents with  . Establish Care   Subjective:     Establish Care Anne Murphy is a 34 y.o. female who presents today to establish care.  She feels well. She reports exercising . She reports she is sleeping well.  Currently in a committed relationship with children. She has taken classes at [employer] and has applied to [employer] [place] but is currently on the waitlist.  -----------------------------------------------------------------   Review of Systems  Constitutional: Negative.   HENT: Negative.   Eyes: Negative.   Respiratory: Negative.   Cardiovascular: Negative.   Gastrointestinal: Positive for abdominal distention and constipation.  Endocrine: Negative.   Genitourinary: Positive for vaginal discharge.  Musculoskeletal: Negative.   Allergic/Immunologic: Negative.   Neurological: Positive for headaches.  Hematological: Negative.   Psychiatric/Behavioral: Negative.     Social History      She  reports that she quit smoking about 2 months ago. She smoked 0.50 packs per day. She has never used smokeless tobacco. She reports current alcohol use. She reports that she does not use drugs.       Social History   Socioeconomic History  . Marital status: Single    Spouse name: Not on file  . Number of children: Not on file  . Years of education: Not on file  . Highest education level: Not on file  Occupational History  . Not on file  Tobacco Use  . Smoking status: Former Smoker    Packs/day: 0.50    Quit date: 02/11/2019    Years since quitting: 0.1  . Smokeless tobacco: Never Used  Substance and Sexual Activity  . Alcohol use: Yes    Comment: occasional  . Drug use: No  . Sexual activity: Yes    Birth control/protection: I.U.D.  Other Topics Concern  . Not on file  Social History  Narrative  . Not on file   Social Determinants of Health   Financial Resource Strain:   . Difficulty of Paying Living Expenses: Not on file  Food Insecurity:   . Worried About Charity fundraiser in the Last Year: Not on file  . Ran Out of Food in the Last Year: Not on file  Transportation Needs:   . Lack of Transportation (Medical): Not on file  . Lack of Transportation (Non-Medical): Not on file  Physical Activity:   . Days of Exercise per Week: Not on file  . Minutes of Exercise per Session: Not on file  Stress:   . Feeling of Stress : Not on file  Social Connections:   . Frequency of Communication with Friends and Family: Not on file  . Frequency of Social Gatherings with Friends and Family: Not on file  . Attends Religious Services: Not on file  . Active Member of Clubs or Organizations: Not on file  . Attends Archivist Meetings: Not on file  . Marital Status: Not on file    Past Medical History:  Diagnosis Date  . Vertigo      Patient Active Problem List   Diagnosis Date Noted  . IUD (intrauterine device) in place 04/20/2018    Past Surgical History:  Procedure Laterality Date  . LEG SURGERY      Family History        Family Status  Relation Name Status  . MGM  Alive  . Mother  Alive  . Brother  Alive  . MGF  Alive        Her family history includes Healthy in her brother and mother; Stroke in her maternal grandmother.      No Known Allergies   Current Outpatient Medications:  .  levonorgestrel (MIRENA) 20 MCG/24HR IUD, by Intrauterine route., Disp: , Rfl:  .  Multiple Vitamin (MULTIVITAMIN) capsule, Take 1 capsule by mouth daily., Disp: , Rfl:    Patient Care Team: Patient, No Pcp Per as PCP - General (General Practice)    Objective:    Vitals: BP 129/88 (BP Location: Right Arm, Patient Position: Sitting, Cuff Size: Normal)   Pulse 92   Temp (!) 97.3 F (36.3 C) (Temporal)   Ht 5\' 6"  (1.676 m)   Wt 175 lb (79.4 kg)   BMI  28.25 kg/m    Vitals:   04/14/19 1423  BP: 129/88  Pulse: 92  Temp: (!) 97.3 F (36.3 C)  TempSrc: Temporal  Weight: 175 lb (79.4 kg)  Height: 5\' 6"  (1.676 m)     Physical Exam Vitals reviewed.  Constitutional:      General: She is not in acute distress.    Appearance: Normal appearance. She is well-developed and overweight. She is not diaphoretic.  HENT:     Head: Normocephalic and atraumatic.     Right Ear: Ear canal and external ear normal.     Left Ear: Tympanic membrane, ear canal and external ear normal.  Eyes:     General: No scleral icterus.       Right eye: No discharge.        Left eye: No discharge.     Extraocular Movements: Extraocular movements intact.     Conjunctiva/sclera: Conjunctivae normal.     Pupils: Pupils are equal, round, and reactive to light.  Neck:     Thyroid: No thyromegaly.     Vascular: No JVD.     Trachea: No tracheal deviation.  Cardiovascular:     Rate and Rhythm: Normal rate and regular rhythm.     Pulses: Normal pulses.     Heart sounds: Normal heart sounds. No murmur. No friction rub. No gallop.   Pulmonary:     Effort: Pulmonary effort is normal. No respiratory distress.     Breath sounds: Normal breath sounds. No wheezing or rales.  Chest:     Chest wall: No tenderness.  Abdominal:     General: Abdomen is flat. Bowel sounds are normal. There is no distension.     Palpations: Abdomen is soft. There is no mass.     Tenderness: There is no abdominal tenderness. There is no guarding or rebound.  Musculoskeletal:        General: No tenderness. Normal range of motion.     Cervical back: Normal range of motion and neck supple.     Right lower leg: No edema.     Left lower leg: No edema.  Lymphadenopathy:     Cervical: No cervical adenopathy.  Skin:    General: Skin is warm and dry.     Capillary Refill: Capillary refill takes less than 2 seconds.     Findings: No rash.  Neurological:     General: No focal deficit present.      Mental Status: She is alert and oriented to person, place, and time. Mental status is at baseline.  Psychiatric:  Mood and Affect: Mood normal.        Behavior: Behavior normal.        Thought Content: Thought content normal.        Judgment: Judgment normal.      Depression Screen No flowsheet data found.     Assessment & Plan:     Routine Health Maintenance and Physical Exam  Exercise Activities and Dietary recommendations Goals   None      There is no immunization history on file for this patient.  Health Maintenance  Topic Date Due  . HIV Screening  05/06/2000  . TETANUS/TDAP  05/06/2004  . INFLUENZA VACCINE  05/11/2019 (Originally 09/11/2018)  . PAP SMEAR-Modifier  04/19/2021     Discussed health benefits of physical activity, and encouraged her to engage in regular exercise appropriate for her age and condition.    1. Annual physical exam Normal physical exam today. Will check labs as below and f/u pending lab results. If labs are stable and WNL she will not need to have these rechecked for one year at her next annual physical exam. She is to call the office in the meantime if she has any acute issue, questions or concerns. - CBC w/Diff/Platelet - Comprehensive Metabolic Panel (CMET) - TSH - Lipid Panel With LDL/HDL Ratio - HgB A1c  2. Lipid screening Will check labs as below and f/u pending results. - CBC w/Diff/Platelet - Comprehensive Metabolic Panel (CMET) - TSH - Lipid Panel With LDL/HDL Ratio - HgB A1c  3. Diabetes mellitus screening Will check labs as below and f/u pending results. - CBC w/Diff/Platelet - Comprehensive Metabolic Panel (CMET) - TSH - Lipid Panel With LDL/HDL Ratio - HgB A1c  4. Weight gain Noted over last few months. Suspected to be secondary to stress. Will check labs as below and f/u pending results. - CBC w/Diff/Platelet - Comprehensive Metabolic Panel (CMET) - TSH - Lipid Panel With LDL/HDL Ratio - HgB  A1c  5. BMI 28.0-28.9,adult Counseled patient on healthy lifestyle modifications including dieting and exercise.  - CBC w/Diff/Platelet - Comprehensive Metabolic Panel (CMET) - TSH - Lipid Panel With LDL/HDL Ratio - HgB A1c  6. Chronic idiopathic constipation Stool softeners help. Can use Miralax prn. Call if worsening.  7. Vaginal discharge Chronic from IUD. Significant other recently had vasectomy so she is hoping to have removed around May-June 2021. Will continue to see Dr. Jeralyn Bennett at [facility] for paps.   8. Tension headache Secondary to stress. Discussed massage, good pillow for support. Can use ice on neck when bad.   --------------------------------------------------------------------    Margaretann Loveless, PA-C  [facility]

## 2019-04-15 ENCOUNTER — Encounter: Payer: Self-pay | Admitting: Physician Assistant

## 2019-04-15 ENCOUNTER — Telehealth: Payer: Self-pay

## 2019-04-15 LAB — COMPREHENSIVE METABOLIC PANEL
ALT: 90 IU/L — ABNORMAL HIGH (ref 0–32)
AST: 30 IU/L (ref 0–40)
Albumin/Globulin Ratio: 1.6 (ref 1.2–2.2)
Albumin: 4.6 g/dL (ref 3.8–4.8)
Alkaline Phosphatase: 84 IU/L (ref 39–117)
BUN/Creatinine Ratio: 12 (ref 9–23)
BUN: 8 mg/dL (ref 6–20)
Bilirubin Total: 0.6 mg/dL (ref 0.0–1.2)
CO2: 23 mmol/L (ref 20–29)
Calcium: 9.6 mg/dL (ref 8.7–10.2)
Chloride: 101 mmol/L (ref 96–106)
Creatinine, Ser: 0.69 mg/dL (ref 0.57–1.00)
GFR calc Af Amer: 132 mL/min/{1.73_m2} (ref >59)
GFR calc non Af Amer: 115 mL/min/{1.73_m2} (ref >59)
Globulin, Total: 2.8 g/dL (ref 1.5–4.5)
Glucose: 80 mg/dL (ref 65–99)
Potassium: 4 mmol/L (ref 3.5–5.2)
Sodium: 139 mmol/L (ref 134–144)
Total Protein: 7.4 g/dL (ref 6.0–8.5)

## 2019-04-15 LAB — CBC WITH DIFFERENTIAL/PLATELET
Basophils Absolute: 0 10*3/uL (ref 0.0–0.2)
Basos: 0 %
EOS (ABSOLUTE): 0.1 10*3/uL (ref 0.0–0.4)
Eos: 1 %
Hematocrit: 40.7 % (ref 34.0–46.6)
Hemoglobin: 14 g/dL (ref 11.1–15.9)
Immature Grans (Abs): 0 10*3/uL (ref 0.0–0.1)
Immature Granulocytes: 0 %
Lymphocytes Absolute: 3.1 10*3/uL (ref 0.7–3.1)
Lymphs: 38 %
MCH: 31.8 pg (ref 26.6–33.0)
MCHC: 34.4 g/dL (ref 31.5–35.7)
MCV: 93 fL (ref 79–97)
Monocytes Absolute: 0.8 10*3/uL (ref 0.1–0.9)
Monocytes: 10 %
Neutrophils Absolute: 4.2 10*3/uL (ref 1.4–7.0)
Neutrophils: 51 %
Platelets: 405 10*3/uL (ref 150–450)
RBC: 4.4 x10E6/uL (ref 3.77–5.28)
RDW: 11.9 % (ref 11.7–15.4)
WBC: 8.3 10*3/uL (ref 3.4–10.8)

## 2019-04-15 LAB — HEMOGLOBIN A1C
Est. average glucose Bld gHb Est-mCnc: 108 mg/dL
Hgb A1c MFr Bld: 5.4 % (ref 4.8–5.6)

## 2019-04-15 LAB — LIPID PANEL WITH LDL/HDL RATIO
Cholesterol, Total: 172 mg/dL (ref 100–199)
HDL: 53 mg/dL (ref >39)
LDL Chol Calc (NIH): 108 mg/dL — ABNORMAL HIGH (ref 0–99)
LDL/HDL Ratio: 2 ratio (ref 0.0–3.2)
Triglycerides: 54 mg/dL (ref 0–149)
VLDL Cholesterol Cal: 11 mg/dL (ref 5–40)

## 2019-04-15 LAB — TSH: TSH: 0.459 u[IU]/mL (ref 0.450–4.500)

## 2019-04-15 NOTE — Telephone Encounter (Signed)
LMTCB 04/15/2019  Thanks,    -Vernona Rieger

## 2019-04-15 NOTE — Telephone Encounter (Signed)
LMTCB 04/15/2019  Thanks,    -Amberrose Friebel  

## 2019-04-15 NOTE — Telephone Encounter (Signed)
-----   Message from Margaretann Loveless, [place] sent at 04/15/2019  7:20 AM EST ----- Blood count is normal. Sugar is normal. Kidney function is normal. One enzyme of the 3 liver enzymes we monitor is elevated. This can be due to external sources including stress. Limit any tylenol (acetaminophen) based products, alcohol and fatty foods. We can recheck in 2-3 months (ok for lab draw only). Sodium, potassium and calcium are normal. Thyroid is normal. Cholesterol has increased compared to labs 11 months ago. A1c is 5.4. Continue working on healthy lifestyle modifications with limiting fatty foods, processed meats, red meats and sugars. Increasing physical activity to try to get in 150 min of moderate activity per week can help also.

## 2019-04-19 NOTE — Telephone Encounter (Signed)
This was done through my chart message.

## 2019-04-22 ENCOUNTER — Other Ambulatory Visit (HOSPITAL_COMMUNITY)
Admission: RE | Admit: 2019-04-22 | Discharge: 2019-04-22 | Disposition: A | Discharge status: Home / Self Care | Payer: Medicaid Other | Source: Ambulatory Visit | Attending: Certified Nurse Midwife | Admitting: Certified Nurse Midwife

## 2019-04-22 ENCOUNTER — Ambulatory Visit (INDEPENDENT_AMBULATORY_CARE_PROVIDER_SITE_OTHER): Payer: Medicaid Other | Admitting: Certified Nurse Midwife

## 2019-04-22 ENCOUNTER — Other Ambulatory Visit: Payer: Self-pay

## 2019-04-22 ENCOUNTER — Encounter: Payer: Self-pay | Admitting: Certified Nurse Midwife

## 2019-04-22 VITALS — BP 106/68 | HR 93 | Ht 65.0 in | Wt 179.4 lb

## 2019-04-22 DIAGNOSIS — Z0000 Encounter for general adult medical examination without abnormal findings: Secondary | ICD-10-CM

## 2019-04-22 DIAGNOSIS — Z01419 Encounter for gynecological examination (general) (routine) without abnormal findings: Secondary | ICD-10-CM | POA: Diagnosis present

## 2019-04-22 DIAGNOSIS — Z975 Presence of (intrauterine) contraceptive device: Secondary | ICD-10-CM | POA: Diagnosis not present

## 2019-04-22 DIAGNOSIS — N898 Other specified noninflammatory disorders of vagina: Secondary | ICD-10-CM | POA: Insufficient documentation

## 2019-04-22 NOTE — Patient Instructions (Signed)
Preventive Care 21-34 Years Old, Female Preventive care refers to visits with your health care provider and lifestyle choices that can promote health and wellness. This includes:  A yearly physical exam. This may also be called an annual well check.  Regular dental visits and eye exams.  Immunizations.  Screening for certain conditions.  Healthy lifestyle choices, such as eating a healthy diet, getting regular exercise, not using drugs or products that contain nicotine and tobacco, and limiting alcohol use. What can I expect for my preventive care visit? Physical exam Your health care provider will check your:  Height and weight. This may be used to calculate body mass index (BMI), which tells if you are at a healthy weight.  Heart rate and blood pressure.  Skin for abnormal spots. Counseling Your health care provider may ask you questions about your:  Alcohol, tobacco, and drug use.  Emotional well-being.  Home and relationship well-being.  Sexual activity.  Eating habits.  Work and work environment.  Method of birth control.  Menstrual cycle.  Pregnancy history. What immunizations do I need?  Influenza (flu) vaccine  This is recommended every year. Tetanus, diphtheria, and pertussis (Tdap) vaccine  You may need a Td booster every 10 years. Varicella (chickenpox) vaccine  You may need this if you have not been vaccinated. Human papillomavirus (HPV) vaccine  If recommended by your health care provider, you may need three doses over 6 months. Measles, mumps, and rubella (MMR) vaccine  You may need at least one dose of MMR. You may also need a second dose. Meningococcal conjugate (MenACWY) vaccine  One dose is recommended if you are age 19-21 years and a first-year college student living in a residence hall, or if you have one of several medical conditions. You may also need additional booster doses. Pneumococcal conjugate (PCV13) vaccine  You may need  this if you have certain conditions and were not previously vaccinated. Pneumococcal polysaccharide (PPSV23) vaccine  You may need one or two doses if you smoke cigarettes or if you have certain conditions. Hepatitis A vaccine  You may need this if you have certain conditions or if you travel or work in places where you may be exposed to hepatitis A. Hepatitis B vaccine  You may need this if you have certain conditions or if you travel or work in places where you may be exposed to hepatitis B. Haemophilus influenzae type b (Hib) vaccine  You may need this if you have certain conditions. You may receive vaccines as individual doses or as more than one vaccine together in one shot (combination vaccines). Talk with your health care provider about the risks and benefits of combination vaccines. What tests do I need?  Blood tests  Lipid and cholesterol levels. These may be checked every 5 years starting at age 20.  Hepatitis C test.  Hepatitis B test. Screening  Diabetes screening. This is done by checking your blood sugar (glucose) after you have not eaten for a while (fasting).  Sexually transmitted disease (STD) testing.  BRCA-related cancer screening. This may be done if you have a family history of breast, ovarian, tubal, or peritoneal cancers.  Pelvic exam and Pap test. This may be done every 3 years starting at age 21. Starting at age 30, this may be done every 5 years if you have a Pap test in combination with an HPV test. Talk with your health care provider about your test results, treatment options, and if necessary, the need for more tests.   Follow these instructions at home: Eating and drinking   Eat a diet that includes fresh fruits and vegetables, whole grains, lean protein, and low-fat dairy.  Take vitamin and mineral supplements as recommended by your health care provider.  Do not drink alcohol if: ? Your health care provider tells you not to drink. ? You are  pregnant, may be pregnant, or are planning to become pregnant.  If you drink alcohol: ? Limit how much you have to 0-1 drink a day. ? Be aware of how much alcohol is in your drink. In the [place] one drink equals one 12 oz bottle of beer (355 mL), one 5 oz glass of wine (148 mL), or one 1 oz glass of hard liquor (44 mL). Lifestyle  Take daily care of your teeth and gums.  Stay active. Exercise for at least 30 minutes on 5 or more days each week.  Do not use any products that contain nicotine or tobacco, such as cigarettes, e-cigarettes, and chewing tobacco. If you need help quitting, ask your health care provider.  If you are sexually active, practice safe sex. Use a condom or other form of birth control (contraception) in order to prevent pregnancy and STIs (sexually transmitted infections). If you plan to become pregnant, see your health care provider for a preconception visit. What's next?  Visit your health care provider once a year for a well check visit.  Ask your health care provider how often you should have your eyes and teeth checked.  Stay up to date on all vaccines. This information is not intended to replace advice given to you by your health care provider. Make sure you discuss any questions you have with your health care provider. Document Revised: 10/08/2017 Document Reviewed: 10/08/2017 Elsevier Patient Education  2020 [employer].

## 2019-04-22 NOTE — Progress Notes (Signed)
Patient here for annual exam.  Patient c/o vaginal odor and discharge "it's on and off with the IUD".  Wants IUD removed after husband goes for his vasectomy follow-up appointment in May.

## 2019-04-23 NOTE — Progress Notes (Signed)
ANNUAL PREVENTATIVE CARE GYN  ENCOUNTER NOTE  Subjective:       Anne Murphy is a 34 y.o. G59P2002 female here for a routine annual gynecologic exam.  Current complaints: 1. Vaginal discharge and odor-"on and off with the IUD"; desires removal after husband's follow up vasectomy visit in May  Denies difficulty breathing or respiratory distress, chest pain, abdominal pain, dysuria, excessive vaginal bleeding, and leg pain or swelling.    Gynecologic History  No LMP recorded (lmp unknown). (Menstrual status: IUD).  Contraception: IUD, Mirena  Last Pap: 04/2018. Results were: Neg/Neg  Obstetric History  OB History  Gravida Para Term Preterm AB Living  2 2 2     2   SAB TAB Ectopic Multiple Live Births          2    # Outcome Date GA Lbr Len/2nd Weight Sex Delivery Anes PTL Lv  2 Term 09/17/11   6 lb 4 oz (2.835 kg) M Vag-Spont Local  LIV  1 Term 11/14/06   6 lb 6 oz (2.892 kg) M Vag-Spont EPI N LIV    Past Medical History:  Diagnosis Date  . Vertigo     Past Surgical History:  Procedure Laterality Date  . LEG SURGERY      Current Outpatient Medications on File Prior to Visit  Medication Sig Dispense Refill  . levonorgestrel (MIRENA) 20 MCG/24HR IUD by Intrauterine route.    . Multiple Vitamin (MULTIVITAMIN) capsule Take 1 capsule by mouth daily.     No current facility-administered medications on file prior to visit.    No Known Allergies  Social History   Socioeconomic History  . Marital status: Single    Spouse name: Not on file  . Number of children: Not on file  . Years of education: Not on file  . Highest education level: Not on file  Occupational History  . Not on file  Tobacco Use  . Smoking status: Former Smoker    Packs/day: 0.50    Quit date: 02/11/2019    Years since quitting: 0.1  . Smokeless tobacco: Never Used  Substance and Sexual Activity  . Alcohol use: Yes    Comment: occasional  . Drug use: No  . Sexual activity: Yes    Birth  control/protection: I.U.D., Other-see comments    Comment: Husband had a vasectomy  Other Topics Concern  . Not on file  Social History Narrative  . Not on file   Social Determinants of Health   Financial Resource Strain:   . Difficulty of Paying Living Expenses:   Food Insecurity:   . Worried About Charity fundraiser in the Last Year:   . Arboriculturist in the Last Year:   Transportation Needs:   . Film/video editor (Medical):   Marland Kitchen Lack of Transportation (Non-Medical):   Physical Activity:   . Days of Exercise per Week:   . Minutes of Exercise per Session:   Stress:   . Feeling of Stress :   Social Connections:   . Frequency of Communication with Friends and Family:   . Frequency of Social Gatherings with Friends and Family:   . Attends Religious Services:   . Active Member of Clubs or Organizations:   . Attends Archivist Meetings:   Marland Kitchen Marital Status:   Intimate Partner Violence:   . Fear of Current or Ex-Partner:   . Emotionally Abused:   Marland Kitchen Physically Abused:   . Sexually Abused:     Family  History  Problem Relation Age of Onset  . Stroke Maternal Grandmother   . Healthy Mother   . Healthy Brother   . Breast cancer Neg Hx   . Ovarian cancer Neg Hx   . Colon cancer Neg Hx     The following portions of the patient's history were reviewed and updated as appropriate: allergies, current medications, past family history, past medical history, past social history, past surgical history and problem list.  Review of Systems  ROS negative except as noted above. Information obtained from patient.   Objective:   BP 106/68   Pulse 93   Ht 5\' 5"  (1.651 m)   Wt 179 lb 6.4 oz (81.4 kg)   LMP  (LMP Unknown)   BMI 29.85 kg/m    CONSTITUTIONAL: Well-developed, well-nourished female in no acute distress.   PSYCHIATRIC: Normal mood and affect. Normal behavior. Normal judgment and thought content.  NEUROLGIC: Alert and oriented to person, place, and  time. Normal muscle tone coordination. No cranial nerve deficit noted.  HENT:  Normocephalic, atraumatic, External right and left ear normal.   EYES: Conjunctivae and EOM are normal. Pupils are equal and round.   NECK: Normal range of motion, supple, no masses.  Normal thyroid.   SKIN: Skin is warm and dry. No rash noted. Not diaphoretic. No erythema. No pallor. Professional tattoos present.   CARDIOVASCULAR: Normal heart rate noted, regular rhythm, no murmur.  RESPIRATORY: Clear to auscultation bilaterally. Effort and breath sounds normal, no problems with respiration noted.  BREASTS: Symmetric in size. No masses, skin changes, nipple drainage, or lymphadenopathy.  ABDOMEN: Soft, normal bowel sounds, no distention noted.  No tenderness, rebound or guarding.   PELVIC:  External Genitalia: Normal  Vagina: Normal, swab collected  Cervix: Normal, IUD strings present   Uterus: Normal  Adnexa: Normal   MUSCULOSKELETAL: Normal range of motion. No tenderness.  No cyanosis, clubbing, or edema.  2+ distal pulses.  LYMPHATIC: No Axillary, Supraclavicular, or Inguinal Adenopathy.  Assessment:   Annual gynecologic examination 34 y.o.   Contraception: IUD, Mirena  Overweight   Problem List Items Addressed This Visit      Other   IUD (intrauterine device) in place   Relevant Orders   Cervicovaginal ancillary only    Other Visit Diagnoses    Well woman exam    -  Primary   Relevant Orders   Cervicovaginal ancillary only   Vaginal discharge       Relevant Orders   Cervicovaginal ancillary only      Plan:   Pap: Not needed   Vaginal swab collected, will contact patient with results  Labs: Managed by PCP   Routine preventative health maintenance measures emphasized: Exercise/Diet/Weight control, Tobacco Warnings, Alcohol/Substance use risks and Stress Management; see AVS  RTC x 2-3 months for Mirena removal or sooner if needed  Return to Clinic - 1 Year   32, CNM [facility], [facility]

## 2019-04-26 LAB — CERVICOVAGINAL ANCILLARY ONLY
Bacterial Vaginitis (gardnerella): NEGATIVE
Candida Glabrata: NEGATIVE
Candida Vaginitis: NEGATIVE
Comment: NEGATIVE
Comment: NEGATIVE
Comment: NEGATIVE

## 2019-07-25 ENCOUNTER — Encounter: Payer: Self-pay | Admitting: Certified Nurse Midwife

## 2019-07-25 ENCOUNTER — Ambulatory Visit (INDEPENDENT_AMBULATORY_CARE_PROVIDER_SITE_OTHER): Payer: Medicaid Other | Admitting: Certified Nurse Midwife

## 2019-07-25 ENCOUNTER — Other Ambulatory Visit: Payer: Self-pay

## 2019-07-25 VITALS — BP 119/74 | HR 72 | Ht 65.0 in | Wt 169.1 lb

## 2019-07-25 DIAGNOSIS — Z30432 Encounter for removal of intrauterine contraceptive device: Secondary | ICD-10-CM | POA: Diagnosis not present

## 2019-07-25 NOTE — Progress Notes (Signed)
Pt present for IUD removal. Pt stated that her partner got a vasotomy and no longer needs the mirena. Pt also stated that her cycles are irregular since getting the IUD and LMP is unknow.

## 2019-07-25 NOTE — Patient Instructions (Signed)
Preventive Care 21-34 Years Old, Female Preventive care refers to visits with your health care provider and lifestyle choices that can promote health and wellness. This includes:  A yearly physical exam. This may also be called an annual well check.  Regular dental visits and eye exams.  Immunizations.  Screening for certain conditions.  Healthy lifestyle choices, such as eating a healthy diet, getting regular exercise, not using drugs or products that contain nicotine and tobacco, and limiting alcohol use. What can I expect for my preventive care visit? Physical exam Your health care provider will check your:  Height and weight. This may be used to calculate body mass index (BMI), which tells if you are at a healthy weight.  Heart rate and blood pressure.  Skin for abnormal spots. Counseling Your health care provider may ask you questions about your:  Alcohol, tobacco, and drug use.  Emotional well-being.  Home and relationship well-being.  Sexual activity.  Eating habits.  Work and work environment.  Method of birth control.  Menstrual cycle.  Pregnancy history. What immunizations do I need?  Influenza (flu) vaccine  This is recommended every year. Tetanus, diphtheria, and pertussis (Tdap) vaccine  You may need a Td booster every 10 years. Varicella (chickenpox) vaccine  You may need this if you have not been vaccinated. Human papillomavirus (HPV) vaccine  If recommended by your health care provider, you may need three doses over 6 months. Measles, mumps, and rubella (MMR) vaccine  You may need at least one dose of MMR. You may also need a second dose. Meningococcal conjugate (MenACWY) vaccine  One dose is recommended if you are age 19-21 years and a first-year college student living in a residence hall, or if you have one of several medical conditions. You may also need additional booster doses. Pneumococcal conjugate (PCV13) vaccine  You may need  this if you have certain conditions and were not previously vaccinated. Pneumococcal polysaccharide (PPSV23) vaccine  You may need one or two doses if you smoke cigarettes or if you have certain conditions. Hepatitis A vaccine  You may need this if you have certain conditions or if you travel or work in places where you may be exposed to hepatitis A. Hepatitis B vaccine  You may need this if you have certain conditions or if you travel or work in places where you may be exposed to hepatitis B. Haemophilus influenzae type b (Hib) vaccine  You may need this if you have certain conditions. You may receive vaccines as individual doses or as more than one vaccine together in one shot (combination vaccines). Talk with your health care provider about the risks and benefits of combination vaccines. What tests do I need?  Blood tests  Lipid and cholesterol levels. These may be checked every 5 years starting at age 20.  Hepatitis C test.  Hepatitis B test. Screening  Diabetes screening. This is done by checking your blood sugar (glucose) after you have not eaten for a while (fasting).  Sexually transmitted disease (STD) testing.  BRCA-related cancer screening. This may be done if you have a family history of breast, ovarian, tubal, or peritoneal cancers.  Pelvic exam and Pap test. This may be done every 3 years starting at age 21. Starting at age 30, this may be done every 5 years if you have a Pap test in combination with an HPV test. Talk with your health care provider about your test results, treatment options, and if necessary, the need for more tests.   Follow these instructions at home: Eating and drinking   Eat a diet that includes fresh fruits and vegetables, whole grains, lean protein, and low-fat dairy.  Take vitamin and mineral supplements as recommended by your health care provider.  Do not drink alcohol if: ? Your health care provider tells you not to drink. ? You are  pregnant, may be pregnant, or are planning to become pregnant.  If you drink alcohol: ? Limit how much you have to 0-1 drink a day. ? Be aware of how much alcohol is in your drink. In the [place] one drink equals one 12 oz bottle of beer (355 mL), one 5 oz glass of wine (148 mL), or one 1 oz glass of hard liquor (44 mL). Lifestyle  Take daily care of your teeth and gums.  Stay active. Exercise for at least 30 minutes on 5 or more days each week.  Do not use any products that contain nicotine or tobacco, such as cigarettes, e-cigarettes, and chewing tobacco. If you need help quitting, ask your health care provider.  If you are sexually active, practice safe sex. Use a condom or other form of birth control (contraception) in order to prevent pregnancy and STIs (sexually transmitted infections). If you plan to become pregnant, see your health care provider for a preconception visit. What's next?  Visit your health care provider once a year for a well check visit.  Ask your health care provider how often you should have your eyes and teeth checked.  Stay up to date on all vaccines. This information is not intended to replace advice given to you by your health care provider. Make sure you discuss any questions you have with your health care provider. Document Revised: 10/08/2017 Document Reviewed: 10/08/2017 Elsevier Patient Education  2020 [employer].

## 2019-07-25 NOTE — Progress Notes (Signed)
Anne Murphy is a 34 y.o. year old G59P2002 Caucasian female who presents for removal of a Mirena IUD. Her Mirena IUD was placed 02/26/2015.   BP 119/74   Pulse 72   Ht 5\' 5"  (1.651 m)   Wt 169 lb 1.6 oz (76.7 kg)   LMP  (LMP Unknown)   BMI 28.14 kg/m   Time out was performed.  A small plastic speculum was placed in the vagina.  The cervix was visualized, and the strings 3 cm visible. They were grasped and the Mirena was easily removed intact without complications.   Provided handout on Phexxi and education regarding barrier pregnancy prevention methods until husband's vasectomy follow up is completed.   Reviewed red flags and when to call the office.  RTC as previously scheduled or sooner if needed.   RN [facility] 07/25/19 8:55 AM

## 2019-09-30 ENCOUNTER — Ambulatory Visit (LOCAL_COMMUNITY_HEALTH_CENTER): Payer: Self-pay

## 2019-09-30 ENCOUNTER — Other Ambulatory Visit: Payer: Self-pay

## 2019-09-30 DIAGNOSIS — Z111 Encounter for screening for respiratory tuberculosis: Secondary | ICD-10-CM

## 2019-09-30 NOTE — Progress Notes (Signed)
Requested / received copy of immunization record from [facility]. Jossie Ng, RN

## 2019-10-03 ENCOUNTER — Ambulatory Visit (LOCAL_COMMUNITY_HEALTH_CENTER): Payer: Medicaid Other

## 2019-10-03 ENCOUNTER — Other Ambulatory Visit: Payer: Self-pay

## 2019-10-03 DIAGNOSIS — Z111 Encounter for screening for respiratory tuberculosis: Secondary | ICD-10-CM

## 2019-10-03 LAB — TB SKIN TEST
Induration: 0 mm
TB Skin Test: NEGATIVE

## 2019-10-10 ENCOUNTER — Other Ambulatory Visit: Payer: Medicaid Other

## 2019-10-10 ENCOUNTER — Ambulatory Visit: Payer: Medicaid Other

## 2019-10-11 ENCOUNTER — Ambulatory Visit (LOCAL_COMMUNITY_HEALTH_CENTER): Payer: Medicaid Other

## 2019-10-11 ENCOUNTER — Other Ambulatory Visit: Payer: Self-pay

## 2019-10-11 DIAGNOSIS — Z23 Encounter for immunization: Secondary | ICD-10-CM | POA: Diagnosis not present

## 2019-10-11 DIAGNOSIS — Z0184 Encounter for antibody response examination: Secondary | ICD-10-CM

## 2019-10-11 NOTE — Progress Notes (Addendum)
Here for Tdap and Varicella titer today as requirement for [employer] school. Tdap  Given and tolerated well. Updated [facility] copy given to pt and explained.  ROI signed. To lab for varicella titer. RN to call with titer results and pt requests to pick up results at info booth when ready. Jerel Shepherd, RN

## 2019-10-12 ENCOUNTER — Telehealth: Payer: Self-pay

## 2019-10-12 LAB — VARICELLA ZOSTER ANTIBODY, IGG: Varicella zoster IgG: 1967 index (ref >165)

## 2019-10-12 NOTE — Telephone Encounter (Signed)
Phone call to pt. Pt counseled about titer results and varicella immunity. Pt to pick up copy at [facility] at reception desk.

## 2019-10-12 NOTE — Telephone Encounter (Signed)
Phone call to pt. Left message on voicemail that RN with [facility] is calling about varicella titer results. Results indicate she is immune to varicella and a varicella vaccine is not indicated. Can pick up results at front reception desk as you step off elevator at [facility].   (per clinic notes, ROI already signed)

## 2019-10-14 ENCOUNTER — Other Ambulatory Visit: Payer: Medicaid Other

## 2019-10-18 ENCOUNTER — Ambulatory Visit (LOCAL_COMMUNITY_HEALTH_CENTER): Payer: Medicaid Other

## 2019-10-18 ENCOUNTER — Other Ambulatory Visit: Payer: Self-pay

## 2019-10-18 DIAGNOSIS — Z111 Encounter for screening for respiratory tuberculosis: Secondary | ICD-10-CM

## 2019-10-21 ENCOUNTER — Ambulatory Visit (LOCAL_COMMUNITY_HEALTH_CENTER): Payer: Medicaid Other

## 2019-10-21 ENCOUNTER — Other Ambulatory Visit: Payer: Self-pay

## 2019-10-21 DIAGNOSIS — Z111 Encounter for screening for respiratory tuberculosis: Secondary | ICD-10-CM

## 2019-10-21 LAB — TB SKIN TEST
Induration: 0 mm
TB Skin Test: NEGATIVE

## 2020-04-16 ENCOUNTER — Encounter: Payer: Medicaid Other | Admitting: Physician Assistant

## 2020-04-18 ENCOUNTER — Encounter: Payer: Self-pay | Admitting: Physician Assistant

## 2020-04-18 ENCOUNTER — Ambulatory Visit: Payer: Medicaid Other | Admitting: Physician Assistant

## 2020-04-18 ENCOUNTER — Other Ambulatory Visit: Payer: Self-pay

## 2020-04-18 VITALS — BP 107/75 | HR 87 | Temp 98.8°F | Ht 66.0 in | Wt 164.0 lb

## 2020-04-18 DIAGNOSIS — Z6826 Body mass index (BMI) 26.0-26.9, adult: Secondary | ICD-10-CM | POA: Diagnosis not present

## 2020-04-18 DIAGNOSIS — R41840 Attention and concentration deficit: Secondary | ICD-10-CM | POA: Diagnosis not present

## 2020-04-18 DIAGNOSIS — Z114 Encounter for screening for human immunodeficiency virus [HIV]: Secondary | ICD-10-CM

## 2020-04-18 DIAGNOSIS — Z1159 Encounter for screening for other viral diseases: Secondary | ICD-10-CM

## 2020-04-18 DIAGNOSIS — Z0000 Encounter for general adult medical examination without abnormal findings: Secondary | ICD-10-CM

## 2020-04-18 NOTE — Patient Instructions (Signed)
Preventive Care 21-35 Years Old, Female Preventive care refers to lifestyle choices and visits with your health care provider that can promote health and wellness. This includes:  A yearly physical exam. This is also called an annual wellness visit.  Regular dental and eye exams.  Immunizations.  Screening for certain conditions.  Healthy lifestyle choices, such as: ? Eating a healthy diet. ? Getting regular exercise. ? Not using drugs or products that contain nicotine and tobacco. ? Limiting alcohol use. What can I expect for my preventive care visit? Physical exam Your health care provider may check your:  Height and weight. These may be used to calculate your BMI (body mass index). BMI is a measurement that tells if you are at a healthy weight.  Heart rate and blood pressure.  Body temperature.  Skin for abnormal spots. Counseling Your health care provider may ask you questions about your:  Past medical problems.  Family's medical history.  Alcohol, tobacco, and drug use.  Emotional well-being.  Home life and relationship well-being.  Sexual activity.  Diet, exercise, and sleep habits.  Work and work environment.  Access to firearms.  Method of birth control.  Menstrual cycle.  Pregnancy history. What immunizations do I need? Vaccines are usually given at various ages, according to a schedule. Your health care provider will recommend vaccines for you based on your age, medical history, and lifestyle or other factors, such as travel or where you work.   What tests do I need? Blood tests  Lipid and cholesterol levels. These may be checked every 5 years starting at age 20.  Hepatitis C test.  Hepatitis B test. Screening  Diabetes screening. This is done by checking your blood sugar (glucose) after you have not eaten for a while (fasting).  STD (sexually transmitted disease) testing, if you are at risk.  BRCA-related cancer screening. This may be  done if you have a family history of breast, ovarian, tubal, or peritoneal cancers.  Pelvic exam and Pap test. This may be done every 3 years starting at age 21. Starting at age 30, this may be done every 5 years if you have a Pap test in combination with an HPV test. Talk with your health care provider about your test results, treatment options, and if necessary, the need for more tests.   Follow these instructions at home: Eating and drinking  Eat a healthy diet that includes fresh fruits and vegetables, whole grains, lean protein, and low-fat dairy products.  Take vitamin and mineral supplements as recommended by your health care provider.  Do not drink alcohol if: ? Your health care provider tells you not to drink. ? You are pregnant, may be pregnant, or are planning to become pregnant.  If you drink alcohol: ? Limit how much you have to 0-1 drink a day. ? Be aware of how much alcohol is in your drink. In the [place] one drink equals one 12 oz bottle of beer (355 mL), one 5 oz glass of wine (148 mL), or one 1 oz glass of hard liquor (44 mL).   Lifestyle  Take daily care of your teeth and gums. Brush your teeth every morning and night with fluoride toothpaste. Floss one time each day.  Stay active. Exercise for at least 30 minutes 5 or more days each week.  Do not use any products that contain nicotine or tobacco, such as cigarettes, e-cigarettes, and chewing tobacco. If you need help quitting, ask your health care provider.  Do not   use drugs.  If you are sexually active, practice safe sex. Use a condom or other form of protection to prevent STIs (sexually transmitted infections).  If you do not wish to become pregnant, use a form of birth control. If you plan to become pregnant, see your health care provider for a prepregnancy visit.  Find healthy ways to cope with stress, such as: ? Meditation, yoga, or listening to music. ? Journaling. ? Talking to a trusted  person. ? Spending time with friends and family. Safety  Always wear your seat belt while driving or riding in a vehicle.  Do not drive: ? If you have been drinking alcohol. Do not ride with someone who has been drinking. ? When you are tired or distracted. ? While texting.  Wear a helmet and other protective equipment during sports activities.  If you have firearms in your house, make sure you follow all gun safety procedures.  Seek help if you have been physically or sexually abused. What's next?  Go to your health care provider once a year for an annual wellness visit.  Ask your health care provider how often you should have your eyes and teeth checked.  Stay up to date on all vaccines. This information is not intended to replace advice given to you by your health care provider. Make sure you discuss any questions you have with your health care provider. Document Revised: 09/25/2019 Document Reviewed: 10/08/2017 Elsevier Patient Education  2021 Elsevier Inc.  

## 2020-04-18 NOTE — Progress Notes (Signed)
Complete physical exam   Patient: Anne Murphy   DOB: [birth year]   35 y.o. Female  MRN: [id number] Visit Date: 04/18/2020  Today's healthcare provider: Margaretann Loveless, PA-C   Chief Complaint  Patient presents with  . Annual Exam   Subjective    Anne Murphy is a 35 y.o. female who presents today for a complete physical exam.  She reports consuming a general diet. The patient does not participate in regular exercise at present. She generally feels well. She reports sleeping well. She does have additional problems to discuss today.  HPI  Left sided pain: Started last year but has since improved in the last month.  Pt describes it as a sharp stabbing pain on her left side.  It would last about 20-30 mins.  She states the pain would come and go but on average at least 1-2 times a week.    Past Medical History:  Diagnosis Date  . Vertigo    Past Surgical History:  Procedure Laterality Date  . LEG SURGERY     Social History   Socioeconomic History  . Marital status: Single    Spouse name: Not on file  . Number of children: Not on file  . Years of education: Not on file  . Highest education level: Not on file  Occupational History  . Not on file  Tobacco Use  . Smoking status: Former Smoker    Packs/day: 0.50    Quit date: 02/11/2019    Years since quitting: 1.1  . Smokeless tobacco: Never Used  Vaping Use  . Vaping Use: Never used  Substance and Sexual Activity  . Alcohol use: Yes    Comment: occasional  . Drug use: No  . Sexual activity: Yes    Birth control/protection: Other-see comments    Comment: Husband had a vasectomy  Other Topics Concern  . Not on file  Social History Narrative  . Not on file   Social Determinants of Health   Financial Resource Strain: Not on file  Food Insecurity: Not on file  Transportation Needs: Not on file  Physical Activity: Not on file  Stress: Not on file  Social Connections: Not on file  Intimate Partner  Violence: Not on file   Family Status  Relation Name Status  . MGM  Alive  . Mother  Alive  . Brother  Alive  . MGF  Alive  . Neg Hx  (Not Specified)   Family History  Problem Relation Age of Onset  . Transient ischemic attack Maternal Grandmother   . Healthy Mother   . Healthy Brother   . Breast cancer Neg Hx   . Ovarian cancer Neg Hx   . Colon cancer Neg Hx    No Known Allergies  Patient Care Team: Reine Just as PCP - General (Family Medicine)   Medications: Outpatient Medications Prior to Visit  Medication Sig  . Multiple Vitamin (MULTIVITAMIN) capsule Take 1 capsule by mouth daily.   No facility-administered medications prior to visit.    Review of Systems  Constitutional: Negative.   HENT: Negative.   Eyes: Negative.   Respiratory: Negative.   Cardiovascular: Negative.   Gastrointestinal: Positive for abdominal pain and constipation. Negative for abdominal distention, anal bleeding, blood in stool, diarrhea, nausea, rectal pain and vomiting.  Endocrine: Positive for cold intolerance.  Genitourinary: Negative.   Musculoskeletal: Negative.   Skin: Negative.   Allergic/Immunologic: Negative.   Neurological: Negative.   Hematological: Negative.  Psychiatric/Behavioral: Positive for agitation and decreased concentration.      Objective    BP 107/75 (BP Location: Left Arm, Patient Position: Sitting, Cuff Size: Large)   Pulse 87   Temp 98.8 F (37.1 C) (Oral)   Ht 5\' 6"  (1.676 m)   Wt 164 lb (74.4 kg)   BMI 26.47 kg/m    Physical Exam Vitals reviewed.  Constitutional:      General: She is not in acute distress.    Appearance: Normal appearance. She is well-developed, well-groomed, overweight and well-nourished. She is not ill-appearing or diaphoretic.  HENT:     Head: Normocephalic and atraumatic.     Right Ear: Tympanic membrane, ear canal and external ear normal.     Left Ear: Tympanic membrane, ear canal and external ear normal.      Mouth/Throat:     Mouth: Oropharynx is clear and moist.  Eyes:     General: No scleral icterus.       Right eye: No discharge.        Left eye: No discharge.     Extraocular Movements: Extraocular movements intact and EOM normal.     Conjunctiva/sclera: Conjunctivae normal.     Pupils: Pupils are equal, round, and reactive to light.  Neck:     Thyroid: No thyromegaly.     Vascular: No JVD.     Trachea: No tracheal deviation.  Cardiovascular:     Rate and Rhythm: Normal rate and regular rhythm.     Pulses: Normal pulses and intact distal pulses.     Heart sounds: Normal heart sounds. No murmur heard. No friction rub. No gallop.   Pulmonary:     Effort: Pulmonary effort is normal. No respiratory distress.     Breath sounds: Normal breath sounds. No wheezing or rales.  Chest:     Chest wall: No tenderness.  Abdominal:     General: Abdomen is flat. Bowel sounds are normal. There is no distension.     Palpations: Abdomen is soft. There is no mass.     Tenderness: There is no abdominal tenderness. There is no guarding or rebound.  Musculoskeletal:        General: No tenderness or edema. Normal range of motion.     Cervical back: Normal range of motion and neck supple. No tenderness.     Right lower leg: No edema.     Left lower leg: No edema.  Lymphadenopathy:     Cervical: No cervical adenopathy.  Skin:    General: Skin is warm and dry.     Capillary Refill: Capillary refill takes less than 2 seconds.     Findings: No rash.  Neurological:     General: No focal deficit present.     Mental Status: She is alert and oriented to person, place, and time. Mental status is at baseline.  Psychiatric:        Mood and Affect: Mood and affect and mood normal.        Behavior: Behavior normal. Behavior is cooperative.        Thought Content: Thought content normal.        Judgment: Judgment normal.     Last depression screening scores PHQ 2/9 Scores 04/18/2020 04/14/2019  PHQ - 2  Score 0 0  PHQ- 9 Score 4 0   Last fall risk screening Fall Risk  04/18/2020  Falls in the past year? 0  Number falls in past yr: 0  Injury with Fall? 0  Risk for fall due to : No Fall Risks  Follow up Falls evaluation completed   Last Audit-C alcohol use screening Alcohol Use Disorder Test (AUDIT) 04/18/2020  1. How often do you have a drink containing alcohol? 2  2. How many drinks containing alcohol do you have on a typical day when you are drinking? 0  3. How often do you have six or more drinks on one occasion? 0  AUDIT-C Score 2  Alcohol Brief Interventions/Follow-up AUDIT Score <7 follow-up not indicated   A score of 3 or more in women, and 4 or more in men indicates increased risk for alcohol abuse, EXCEPT if all of the points are from question 1   No results found for any visits on 04/18/20.  Assessment & Plan    Routine Health Maintenance and Physical Exam  Exercise Activities and Dietary recommendations Goals   None     Immunization History  Administered Date(s) Administered  . Influenza-Unspecified 11/25/2019  . PPD Test 09/30/2019, 10/18/2019  . Tdap 10/11/2019    Health Maintenance  Topic Date Due  . Hepatitis C Screening  Never done  . COVID-19 Vaccine (1) 10/15/2020 (Originally [birth year]1990)  . PAP SMEAR-Modifier  04/19/2021  . TETANUS/TDAP  10/10/2029  . INFLUENZA VACCINE  Completed  . HIV Screening  Completed  . HPV VACCINES  Aged Out    Discussed health benefits of physical activity, and encouraged her to engage in regular exercise appropriate for her age and condition.  1. Annual physical exam Normal physical exam today. Will check labs as below and f/u pending lab results. If labs are stable and WNL she will not need to have these rechecked for one year at her next annual physical exam. She is to call the office in the meantime if she has any acute issue, questions or concerns. - CBC w/Diff/Platelet - Comprehensive Metabolic Panel (CMET) - TSH -  Lipid Panel With LDL/HDL Ratio - HgB A1c  2. BMI 26.0-26.9,adult Counseled patient on healthy lifestyle modifications including dieting and exercise.  Doing well. Has changed eating habits.  - Comprehensive Metabolic Panel (CMET) - TSH - Lipid Panel With LDL/HDL Ratio - HgB A1c  3. Encounter for hepatitis C screening test for low risk patient Will check labs as below and f/u pending results. - Hepatitis C Antibody  4. Screening for HIV without presence of risk factors Will check labs as below and f/u pending results. - HIV antibody (with reflex)  5. Difficulty concentrating Patient having increased issues with concentration. Her two kids do have ADHD. She is interested in testing to see if she does have this. She is unsure if she would want treatment at this time, but is interested in possible CBT if she does have ADHD. Referral placed as below.  - Ambulatory referral to Psychology   No follow-ups on file.     Delmer Islam, PA-C, have reviewed all documentation for this visit. The documentation on 04/18/20 for the exam, diagnosis, procedures, and orders are all accurate and complete.   Reine Just  [facility] [phone] (phone) [phone] (fax)  [facility]

## 2020-04-19 ENCOUNTER — Telehealth: Payer: Self-pay

## 2020-04-19 DIAGNOSIS — R899 Unspecified abnormal finding in specimens from other organs, systems and tissues: Secondary | ICD-10-CM

## 2020-04-19 LAB — CBC WITH DIFFERENTIAL/PLATELET
Basophils Absolute: 0 10*3/uL (ref 0.0–0.2)
Basos: 0 %
EOS (ABSOLUTE): 0.1 10*3/uL (ref 0.0–0.4)
Eos: 1 %
Hematocrit: 39.1 % (ref 34.0–46.6)
Hemoglobin: 13.7 g/dL (ref 11.1–15.9)
Immature Grans (Abs): 0 10*3/uL (ref 0.0–0.1)
Immature Granulocytes: 0 %
Lymphocytes Absolute: 2.4 10*3/uL (ref 0.7–3.1)
Lymphs: 30 %
MCH: 32.7 pg (ref 26.6–33.0)
MCHC: 35 g/dL (ref 31.5–35.7)
MCV: 93 fL (ref 79–97)
Monocytes Absolute: 0.7 10*3/uL (ref 0.1–0.9)
Monocytes: 9 %
Neutrophils Absolute: 4.6 10*3/uL (ref 1.4–7.0)
Neutrophils: 60 %
Platelets: 263 10*3/uL (ref 150–450)
RBC: 4.19 x10E6/uL (ref 3.77–5.28)
RDW: 11.8 % (ref 11.7–15.4)
WBC: 7.8 10*3/uL (ref 3.4–10.8)

## 2020-04-19 LAB — COMPREHENSIVE METABOLIC PANEL
ALT: 11 IU/L (ref 0–32)
AST: 13 IU/L (ref 0–40)
Albumin/Globulin Ratio: 1.7 (ref 1.2–2.2)
Albumin: 4.4 g/dL (ref 3.8–4.8)
Alkaline Phosphatase: 64 IU/L (ref 44–121)
BUN/Creatinine Ratio: 13 (ref 9–23)
BUN: 10 mg/dL (ref 6–20)
Bilirubin Total: 0.2 mg/dL (ref 0.0–1.2)
CO2: 22 mmol/L (ref 20–29)
Calcium: 9.9 mg/dL (ref 8.7–10.2)
Chloride: 104 mmol/L (ref 96–106)
Creatinine, Ser: 0.75 mg/dL (ref 0.57–1.00)
Globulin, Total: 2.6 g/dL (ref 1.5–4.5)
Glucose: 88 mg/dL (ref 65–99)
Potassium: 4.2 mmol/L (ref 3.5–5.2)
Sodium: 142 mmol/L (ref 134–144)
Total Protein: 7 g/dL (ref 6.0–8.5)
eGFR: 107 mL/min/{1.73_m2} (ref >59)

## 2020-04-19 LAB — HEMOGLOBIN A1C
Est. average glucose Bld gHb Est-mCnc: 103 mg/dL
Hgb A1c MFr Bld: 5.2 % (ref 4.8–5.6)

## 2020-04-19 LAB — LIPID PANEL WITH LDL/HDL RATIO
Cholesterol, Total: 144 mg/dL (ref 100–199)
HDL: 50 mg/dL (ref >39)
LDL Chol Calc (NIH): 73 mg/dL (ref 0–99)
LDL/HDL Ratio: 1.5 ratio (ref 0.0–3.2)
Triglycerides: 120 mg/dL (ref 0–149)
VLDL Cholesterol Cal: 21 mg/dL (ref 5–40)

## 2020-04-19 LAB — HEPATITIS C ANTIBODY: Hep C Virus Ab: <0.1 s/co ratio (ref 0.0–0.9)

## 2020-04-19 LAB — TSH: TSH: 0.331 u[IU]/mL — ABNORMAL LOW (ref 0.450–4.500)

## 2020-04-19 LAB — HIV ANTIBODY (ROUTINE TESTING W REFLEX): HIV Screen 4th Generation wRfx: NONREACTIVE

## 2020-04-19 NOTE — Telephone Encounter (Signed)
-----   Message from Margaretann Loveless, [place] sent at 04/19/2020  8:32 AM EST ----- Marchelle Folks,  Thyroid is slightly overactive. Being that this is the first time, I normally recheck and see if it normalizes. Lets recheck this in 6 weeks. If still abnormal we will work this up. All other labs are normal.   Daiva Nakayama, PAC

## 2020-04-19 NOTE — Telephone Encounter (Signed)
Patient has been advised, will place future order in chart. KW

## 2020-05-10 ENCOUNTER — Telehealth: Payer: Self-pay

## 2020-05-10 ENCOUNTER — Other Ambulatory Visit: Payer: Self-pay

## 2020-05-10 DIAGNOSIS — Z111 Encounter for screening for respiratory tuberculosis: Secondary | ICD-10-CM

## 2020-05-10 DIAGNOSIS — R899 Unspecified abnormal finding in specimens from other organs, systems and tissues: Secondary | ICD-10-CM

## 2020-05-10 NOTE — Telephone Encounter (Signed)
Quantiferon gold ordered

## 2020-05-10 NOTE — Telephone Encounter (Signed)
Copied from CRM [id number]. Topic: Appointment Scheduling - Scheduling Inquiry for Clinic >> May 10, 2020  9:34 AM Leafy Ro wrote: Reason for CRM: Pt need quantiferon blood test instead of tb skin test for [facility] requirment. Pt will also drop off a form to be completed

## 2020-05-10 NOTE — Telephone Encounter (Signed)
Pt advised.  Pt states she will be in tomorrow.  She is also going to get her TSH rechecked as well.   Thanks,   -Vernona Rieger

## 2020-05-13 LAB — QUANTIFERON-TB GOLD PLUS
QuantiFERON Mitogen Value: >10 IU/mL
QuantiFERON Nil Value: 0.05 IU/mL
QuantiFERON TB1 Ag Value: 0.04 IU/mL
QuantiFERON TB2 Ag Value: 0.06 IU/mL
QuantiFERON-TB Gold Plus: NEGATIVE

## 2020-05-13 LAB — TSH: TSH: 0.445 u[IU]/mL — ABNORMAL LOW (ref 0.450–4.500)

## 2020-05-18 ENCOUNTER — Ambulatory Visit (INDEPENDENT_AMBULATORY_CARE_PROVIDER_SITE_OTHER): Payer: Medicaid Other | Admitting: Certified Nurse Midwife

## 2020-05-18 ENCOUNTER — Telehealth: Payer: Self-pay

## 2020-05-18 ENCOUNTER — Encounter: Payer: Self-pay | Admitting: Certified Nurse Midwife

## 2020-05-18 ENCOUNTER — Other Ambulatory Visit: Payer: Self-pay

## 2020-05-18 VITALS — BP 101/70 | HR 84 | Resp 16 | Ht 65.0 in | Wt 161.9 lb

## 2020-05-18 DIAGNOSIS — R7989 Other specified abnormal findings of blood chemistry: Secondary | ICD-10-CM | POA: Diagnosis not present

## 2020-05-18 DIAGNOSIS — Z01419 Encounter for gynecological examination (general) (routine) without abnormal findings: Secondary | ICD-10-CM

## 2020-05-18 NOTE — Patient Instructions (Addendum)
Preventive Care 21-35 Years Old, Female Preventive care refers to lifestyle choices and visits with your health care provider that can promote health and wellness. This includes:  A yearly physical exam. This is also called an annual wellness visit.  Regular dental and eye exams.  Immunizations.  Screening for certain conditions.  Healthy lifestyle choices, such as: ? Eating a healthy diet. ? Getting regular exercise. ? Not using drugs or products that contain nicotine and tobacco. ? Limiting alcohol use. What can I expect for my preventive care visit? Physical exam Your health care provider may check your:  Height and weight. These may be used to calculate your BMI (body mass index). BMI is a measurement that tells if you are at a healthy weight.  Heart rate and blood pressure.  Body temperature.  Skin for abnormal spots. Counseling Your health care provider may ask you questions about your:  Past medical problems.  Family's medical history.  Alcohol, tobacco, and drug use.  Emotional well-being.  Home life and relationship well-being.  Sexual activity.  Diet, exercise, and sleep habits.  Work and work environment.  Access to firearms.  Method of birth control.  Menstrual cycle.  Pregnancy history. What immunizations do I need? Vaccines are usually given at various ages, according to a schedule. Your health care provider will recommend vaccines for you based on your age, medical history, and lifestyle or other factors, such as travel or where you work.   What tests do I need? Blood tests  Lipid and cholesterol levels. These may be checked every 5 years starting at age 20.  Hepatitis C test.  Hepatitis B test. Screening  Diabetes screening. This is done by checking your blood sugar (glucose) after you have not eaten for a while (fasting).  STD (sexually transmitted disease) testing, if you are at risk.  BRCA-related cancer screening. This may be  done if you have a family history of breast, ovarian, tubal, or peritoneal cancers.  Pelvic exam and Pap test. This may be done every 3 years starting at age 21. Starting at age 30, this may be done every 5 years if you have a Pap test in combination with an HPV test. Talk with your health care provider about your test results, treatment options, and if necessary, the need for more tests.   Follow these instructions at home: Eating and drinking  Eat a healthy diet that includes fresh fruits and vegetables, whole grains, lean protein, and low-fat dairy products.  Take vitamin and mineral supplements as recommended by your health care provider.  Do not drink alcohol if: ? Your health care provider tells you not to drink. ? You are pregnant, may be pregnant, or are planning to become pregnant.  If you drink alcohol: ? Limit how much you have to 0-1 drink a day. ? Be aware of how much alcohol is in your drink. In the [place] one drink equals one 12 oz bottle of beer (355 mL), one 5 oz glass of wine (148 mL), or one 1 oz glass of hard liquor (44 mL).   Lifestyle  Take daily care of your teeth and gums. Brush your teeth every morning and night with fluoride toothpaste. Floss one time each day.  Stay active. Exercise for at least 30 minutes 5 or more days each week.  Do not use any products that contain nicotine or tobacco, such as cigarettes, e-cigarettes, and chewing tobacco. If you need help quitting, ask your health care provider.  Do not   use drugs.  If you are sexually active, practice safe sex. Use a condom or other form of protection to prevent STIs (sexually transmitted infections).  If you do not wish to become pregnant, use a form of birth control. If you plan to become pregnant, see your health care provider for a prepregnancy visit.  Find healthy ways to cope with stress, such as: ? Meditation, yoga, or listening to music. ? Journaling. ? Talking to a trusted  person. ? Spending time with friends and family. Safety  Always wear your seat belt while driving or riding in a vehicle.  Do not drive: ? If you have been drinking alcohol. Do not ride with someone who has been drinking. ? When you are tired or distracted. ? While texting.  Wear a helmet and other protective equipment during sports activities.  If you have firearms in your house, make sure you follow all gun safety procedures.  Seek help if you have been physically or sexually abused. What's next?  Go to your health care provider once a year for an annual wellness visit.  Ask your health care provider how often you should have your eyes and teeth checked.  Stay up to date on all vaccines. This information is not intended to replace advice given to you by your health care provider. Make sure you discuss any questions you have with your health care provider. Document Revised: 09/25/2019 Document Reviewed: 10/08/2017 Elsevier Patient Education  2021 [employer]vier Inc.  

## 2020-05-18 NOTE — Progress Notes (Signed)
ANNUAL PREVENTATIVE CARE GYN  ENCOUNTER NOTE  Subjective:       Anne Murphy is a 35 y.o. G28P2002 female here for a routine annual gynecologic exam.  Current complaints: 1. Low TSH   Denies difficulty breathing or respiratory distress, chest pain, abdominal pain, excessive vaginal bleeding, dysuria, and leg pain or swelling.     Gynecologic History  Patient's last menstrual period was 04/27/2020 (approximate).  Contraception: vasectomy  Last Pap: 04/2018. Results were: Neg/Neg  Obstetric History  OB History  Gravida Para Term Preterm AB Living  2 2 2     2   SAB IAB Ectopic Multiple Live Births          2    # Outcome Date GA Lbr Len/2nd Weight Sex Delivery Anes PTL Lv  2 Term 09/17/11   6 lb 4 oz (2.835 kg) M Vag-Spont Local  LIV  1 Term 11/14/06   6 lb 6 oz (2.892 kg) M Vag-Spont EPI N LIV    Past Medical History:  Diagnosis Date  . Vertigo     Past Surgical History:  Procedure Laterality Date  . LEG SURGERY      No Known Allergies  Social History   Socioeconomic History  . Marital status: Single    Spouse name: Not on file  . Number of children: Not on file  . Years of education: Not on file  . Highest education level: Not on file  Occupational History  . Not on file  Tobacco Use  . Smoking status: Former Smoker    Packs/day: 0.50    Quit date: 02/11/2019    Years since quitting: 1.2  . Smokeless tobacco: Never Used  Vaping Use  . Vaping Use: Never used  Substance and Sexual Activity  . Alcohol use: Yes    Comment: occasional  . Drug use: No  . Sexual activity: Yes    Birth control/protection: Other-see comments    Comment: Husband had a vasectomy  Other Topics Concern  . Not on file  Social History Narrative  . Not on file   Social Determinants of Health   Financial Resource Strain: Not on file  Food Insecurity: Not on file  Transportation Needs: Not on file  Physical Activity: Not on file  Stress: Not on file  Social Connections:  Not on file  Intimate Partner Violence: Not on file    Family History  Problem Relation Age of Onset  . Transient ischemic attack Maternal Grandmother   . Healthy Mother   . Healthy Brother   . Breast cancer Neg Hx   . Ovarian cancer Neg Hx   . Colon cancer Neg Hx     The following portions of the patient's history were reviewed and updated as appropriate: allergies, current medications, past family history, past medical history, past social history, past surgical history and problem list.  Review of Systems  ROS negative except as noted above. Information obtained from patient.    Objective:   BP 101/70   Pulse 84   Resp 16   Ht 5\' 5"  (1.651 m)   Wt 161 lb 14.4 oz (73.4 kg)   LMP 04/27/2020 (Approximate)   BMI 26.94 kg/m   CONSTITUTIONAL: Well-developed, well-nourished female in no acute distress.   PSYCHIATRIC: Normal mood and affect. Normal behavior. Normal judgment and thought content.  NEUROLGIC: Alert and oriented to person, place, and time. Normal muscle tone coordination. No cranial nerve deficit noted.  HENT:  Normocephalic, atraumatic.  EYES: Conjunctivae and  EOM are normal.   NECK: Normal range of motion, supple, no masses.  Normal thyroid.   SKIN: Skin is warm and dry. No rash noted. Not diaphoretic. No erythema. No pallor. Professional tattoos present.   CARDIOVASCULAR: Normal heart rate noted, regular rhythm, no murmur.  RESPIRATORY: Clear to auscultation bilaterally. Effort and breath sounds normal, no problems with respiration noted.  BREASTS: Symmetric in size. No masses, skin changes, nipple drainage, or lymphadenopathy.  ABDOMEN: Soft, normal bowel sounds, no distention noted.  No tenderness, rebound or guarding.   PELVIC:  External Genitalia: Normal  Vagina: Normal  Cervix: Normal  Uterus: Normal  Adnexa: Normal   MUSCULOSKELETAL: Normal range of motion. No tenderness.  No cyanosis, clubbing, or edema.  2+ distal pulses.  LYMPHATIC: No  Axillary, Supraclavicular, or Inguinal Adenopathy.  Assessment:   Annual gynecologic examination 35 y.o.  Contraception: vasectomy  Overweight  Problem List Items Addressed This Visit   None   Visit Diagnoses    Well woman exam    -  Primary   Low TSH level          Plan:   Pap: Not needed  Labs: Managed by PCP, offered to repeat TSH if needed  Routine preventative health maintenance measures emphasized: Exercise/Diet/Weight control, Tobacco Warnings, Alcohol/Substance use risks and Stress Management; see AVS  Reviewed red flag symptoms and when to call  Return to Clinic - 1 Year for [facility] or sooner if needed   Serafina Royals, CNM  [facility], [facility] 05/18/20 2:31 PM

## 2020-05-18 NOTE — Telephone Encounter (Signed)
Pt came by the office to pick up forms and stated that after her recent TSH results it was suggested that she be referred to endocrinology. Pt stated that she would like to move forward with the referral and doesn't have a request for an office or provider. Ok to see whom she is referred. Pt was seeing Antony Contras.  Please advise. Thanks TNP

## 2020-06-04 DIAGNOSIS — H5213 Myopia, bilateral: Secondary | ICD-10-CM | POA: Diagnosis not present

## 2020-10-04 DIAGNOSIS — E049 Nontoxic goiter, unspecified: Secondary | ICD-10-CM | POA: Diagnosis not present

## 2020-10-04 DIAGNOSIS — E0590 Thyrotoxicosis, unspecified without thyrotoxic crisis or storm: Secondary | ICD-10-CM | POA: Diagnosis not present

## 2020-11-06 DIAGNOSIS — E049 Nontoxic goiter, unspecified: Secondary | ICD-10-CM | POA: Diagnosis not present

## 2020-11-08 DIAGNOSIS — E042 Nontoxic multinodular goiter: Secondary | ICD-10-CM | POA: Diagnosis not present

## 2021-04-22 ENCOUNTER — Encounter: Payer: Medicaid Other | Admitting: Family Medicine

## 2021-05-09 DIAGNOSIS — E0590 Thyrotoxicosis, unspecified without thyrotoxic crisis or storm: Secondary | ICD-10-CM | POA: Diagnosis not present

## 2021-05-09 DIAGNOSIS — E042 Nontoxic multinodular goiter: Secondary | ICD-10-CM | POA: Diagnosis not present

## 2021-05-17 ENCOUNTER — Encounter: Payer: Self-pay | Admitting: Family Medicine

## 2021-05-17 ENCOUNTER — Ambulatory Visit: Payer: Medicaid Other | Admitting: Family Medicine

## 2021-05-17 VITALS — BP 112/76 | HR 70 | Temp 98.1°F | Resp 16 | Ht 65.5 in | Wt 166.7 lb

## 2021-05-17 DIAGNOSIS — Z0000 Encounter for general adult medical examination without abnormal findings: Secondary | ICD-10-CM | POA: Diagnosis not present

## 2021-05-17 NOTE — Progress Notes (Signed)
? ?[employer] as a Neurosurgeon for Jacky Kindle, FNP.,have documented all relevant documentation on the behalf of Jacky Kindle, FNP,as directed by  Jacky Kindle, FNP while in the presence of Jacky Kindle, FNP.  ?Complete physical exam ? ?Patient: Anne Murphy   DOB: 1985/05/16   36 y.o. Female  MRN: [id number] ? ?Visit Date: 05/17/2021 ? ?Today's healthcare provider: Jacky Kindle, FNP  ? ?Introduced to Publishing rights manager role and practice setting.  All questions answered.  Discussed provider/patient relationship and expectations. ? ?Chief Complaint  ?Patient presents with  ? Annual Exam  ? ?Subjective  ?  ?HPI  ?Anne Murphy is a 36 y.o. female who presents today for a complete physical exam.  ?She reports consuming a general diet. The patient does not participate in regular exercise at present. She generally feels well. She reports sleeping well. She does not have additional problems to discuss today.  ?Last Reported ?Pap-04/20/2018 ? ?Past Medical History:  ?Diagnosis Date  ? Vertigo   ? ?Past Surgical History:  ?Procedure Laterality Date  ? LEG SURGERY    ? ?Social History  ? ?Socioeconomic History  ? Marital status: Single  ?  Spouse name: Not on file  ? Number of children: Not on file  ? Years of education: Not on file  ? Highest education level: Not on file  ?Occupational History  ? Not on file  ?Tobacco Use  ? Smoking status: Former  ?  Packs/day: 0.50  ?  Types: Cigarettes  ?  Quit date: 02/11/2019  ?  Years since quitting: 2.2  ? Smokeless tobacco: Never  ?Vaping Use  ? Vaping Use: Never used  ?Substance and Sexual Activity  ? Alcohol use: Yes  ?  Comment: occasional  ? Drug use: No  ? Sexual activity: Yes  ?  Birth control/protection: Other-see comments  ?  Comment: Husband had a vasectomy  ?Other Topics Concern  ? Not on file  ?Social History Narrative  ? Not on file  ? ?Social Determinants of Health  ? ?Financial Resource Strain: Not on file  ?Food Insecurity: Not on file   ?Transportation Needs: Not on file  ?Physical Activity: Not on file  ?Stress: Not on file  ?Social Connections: Not on file  ?Intimate Partner Violence: Not on file  ? ?Family Status  ?Relation Name Status  ? MGM  Alive  ? Mother  Alive  ? Brother  Alive  ? MGF  Alive  ? Neg Hx  (Not Specified)  ? ?Family History  ?Problem Relation Age of Onset  ? Transient ischemic attack Maternal Grandmother   ? Healthy Mother   ? Healthy Brother   ? Breast cancer Neg Hx   ? Ovarian cancer Neg Hx   ? Colon cancer Neg Hx   ? ?No Known Allergies  ?Patient Care Team: ?Jacky Kindle, FNP as PCP - General (Family Medicine)  ? ?Medications: ?No outpatient medications prior to visit.  ? ?No facility-administered medications prior to visit.  ? ? ?Review of Systems  ?All other systems reviewed and are negative. ? ? ? Objective  ?  ?BP 112/76   Pulse 70   Temp 98.1 ?F (36.7 ?C) (Temporal)   Resp 16   Ht 5' 5.5" (1.664 m) Comment: patient reports  Wt 166 lb 11.2 oz (75.6 kg)   LMP  (Within Weeks)   SpO2 99%   BMI 27.32 kg/m?  ? ?Physical Exam ?Vitals and nursing note reviewed.  ?Constitutional:   ?  General: She is awake. She is not in acute distress. ?   Appearance: Normal appearance. She is well-developed, well-groomed and overweight. She is not ill-appearing, toxic-appearing or diaphoretic.  ?HENT:  ?   Head: Normocephalic and atraumatic.  ?   Jaw: There is normal jaw occlusion. No trismus, tenderness, swelling or pain on movement.  ?   Right Ear: Hearing, tympanic membrane, ear canal and external ear normal. There is no impacted cerumen.  ?   Left Ear: Hearing, tympanic membrane, ear canal and external ear normal. There is no impacted cerumen.  ?   Nose: Nose normal. No congestion or rhinorrhea.  ?   Right Turbinates: Not enlarged, swollen or pale.  ?   Left Turbinates: Not enlarged, swollen or pale.  ?   Right Sinus: No maxillary sinus tenderness or frontal sinus tenderness.  ?   Left Sinus: No maxillary sinus tenderness or  frontal sinus tenderness.  ?   Mouth/Throat:  ?   Lips: Pink.  ?   Mouth: Mucous membranes are moist. No injury.  ?   Tongue: No lesions.  ?   Pharynx: Oropharynx is clear. Uvula midline. No pharyngeal swelling, oropharyngeal exudate, posterior oropharyngeal erythema or uvula swelling.  ?   Tonsils: No tonsillar exudate or tonsillar abscesses.  ?Eyes:  ?   General: Lids are normal. Lids are everted, no foreign bodies appreciated. Vision grossly intact. Gaze aligned appropriately. No allergic shiner or visual field deficit.    ?   Right eye: No discharge.     ?   Left eye: No discharge.  ?   Extraocular Movements: Extraocular movements intact.  ?   Conjunctiva/sclera: Conjunctivae normal.  ?   Right eye: Right conjunctiva is not injected. No exudate. ?   Left eye: Left conjunctiva is not injected. No exudate. ?   Pupils: Pupils are equal, round, and reactive to light.  ?Neck:  ?   Thyroid: No thyroid mass, thyromegaly or thyroid tenderness.  ?   Vascular: No carotid bruit.  ?   Trachea: Trachea normal.  ?Cardiovascular:  ?   Rate and Rhythm: Normal rate and regular rhythm.  ?   Pulses: Normal pulses.     ?     Carotid pulses are 2+ on the right side and 2+ on the left side. ?     Radial pulses are 2+ on the right side and 2+ on the left side.  ?     Dorsalis pedis pulses are 2+ on the right side and 2+ on the left side.  ?     Posterior tibial pulses are 2+ on the right side and 2+ on the left side.  ?   Heart sounds: Normal heart sounds, S1 normal and S2 normal. No murmur heard. ?  No friction rub. No gallop.  ?Pulmonary:  ?   Effort: Pulmonary effort is normal. No respiratory distress.  ?   Breath sounds: Normal breath sounds and air entry. No stridor. No wheezing, rhonchi or rales.  ?Chest:  ?   Chest wall: No tenderness.  ?Abdominal:  ?   General: Abdomen is flat. Bowel sounds are normal. There is no distension.  ?   Palpations: Abdomen is soft. There is no mass.  ?   Tenderness: There is no abdominal tenderness.  There is no right CVA tenderness, left CVA tenderness, guarding or rebound.  ?   Hernia: No hernia is present.  ?Genitourinary: ?   Comments: PAP smear due; encouraged patient to call  for appt at GYN ?Musculoskeletal:     ?   General: No swelling, tenderness, deformity or signs of injury. Normal range of motion.  ?   Cervical back: Full passive range of motion without pain, normal range of motion and neck supple. No edema, rigidity or tenderness. No muscular tenderness.  ?   Right lower leg: No edema.  ?   Left lower leg: No edema.  ?Lymphadenopathy:  ?   Cervical: No cervical adenopathy.  ?   Right cervical: No superficial, deep or posterior cervical adenopathy. ?   Left cervical: No superficial, deep or posterior cervical adenopathy.  ?Skin: ?   General: Skin is warm and dry.  ?   Capillary Refill: Capillary refill takes less than 2 seconds.  ?   Coloration: Skin is not jaundiced or pale.  ?   Findings: No bruising, erythema, lesion or rash.  ?Neurological:  ?   General: No focal deficit present.  ?   Mental Status: She is alert and oriented to person, place, and time. Mental status is at baseline.  ?   GCS: GCS eye subscore is 4. GCS verbal subscore is 5. GCS motor subscore is 6.  ?   Sensory: Sensation is intact. No sensory deficit.  ?   Motor: Motor function is intact. No weakness.  ?   Coordination: Coordination is intact. Coordination normal.  ?   Gait: Gait is intact. Gait normal.  ?Psychiatric:     ?   Attention and Perception: Attention and perception normal.     ?   Mood and Affect: Mood and affect normal.     ?   Speech: Speech normal.     ?   Behavior: Behavior normal. Behavior is cooperative.     ?   Thought Content: Thought content normal.     ?   Cognition and Memory: Cognition and memory normal.     ?   Judgment: Judgment normal.  ? ?Last depression screening scores ? ?  05/17/2021  ?  9:28 AM 05/18/2020  ?  2:22 PM 04/18/2020  ?  2:28 PM  ?PHQ 2/9 Scores  ?PHQ - 2 Score 0 0 0  ?PHQ- 9 Score 0  4  ? ?Last  fall risk screening ? ?  05/18/2020  ?  2:07 PM  ?Fall Risk   ?Falls in the past year? 0  ?Number falls in past yr: 0  ?Injury with Fall? 0  ? ?Last Audit-C alcohol use screening ? ?  05/17/2021  ?  9:28 AM  ?Alcohol Use Dis

## 2021-05-17 NOTE — Assessment & Plan Note (Signed)
UTD on vision ?Due for dental cleaning; has been in contact with dental practice [place] ?In school for nursing at [employer] ?Hx of vertigo- resolved with ENT ?Hx of leg sx, s/p rod placement- has improved since strengthing muscles around the previous injury site ?Things to do to keep yourself healthy  ?- Exercise at least 30-45 minutes a day, 3-4 days a week.  ?- Eat a low-fat diet with lots of fruits and vegetables, up to 7-9 servings per day.  ?- Seatbelts can save your life. Wear them always.  ?- Smoke detectors on every level of your home, check batteries every year.  ?- Eye Doctor - have an eye exam every 1-2 years  ?- Safe sex - if you may be exposed to STDs, use a condom.  ?- Alcohol -  If you drink, do it moderately, less than 2 drinks per day.  ?- Health Care Power of [place]. Choose someone to speak for you if you are not able.  ?- Depression is common in our stressful world.If you're feeling down or losing interest in things you normally enjoy, please come in for a visit.  ?- Violence - If anyone is threatening or hurting you, please call immediately. ? ? ?

## 2021-05-18 LAB — COMPREHENSIVE METABOLIC PANEL
ALT: 13 IU/L (ref 0–32)
AST: 12 IU/L (ref 0–40)
Albumin/Globulin Ratio: 1.9 (ref 1.2–2.2)
Albumin: 4.4 g/dL (ref 3.8–4.8)
Alkaline Phosphatase: 61 IU/L (ref 44–121)
BUN/Creatinine Ratio: 11 (ref 9–23)
BUN: 7 mg/dL (ref 6–20)
Bilirubin Total: 0.4 mg/dL (ref 0.0–1.2)
CO2: 22 mmol/L (ref 20–29)
Calcium: 9.3 mg/dL (ref 8.7–10.2)
Chloride: 104 mmol/L (ref 96–106)
Creatinine, Ser: 0.61 mg/dL (ref 0.57–1.00)
Globulin, Total: 2.3 g/dL (ref 1.5–4.5)
Glucose: 97 mg/dL (ref 70–99)
Potassium: 4 mmol/L (ref 3.5–5.2)
Sodium: 139 mmol/L (ref 134–144)
Total Protein: 6.7 g/dL (ref 6.0–8.5)
eGFR: 119 mL/min/{1.73_m2} (ref >59)

## 2021-05-18 LAB — CBC WITH DIFFERENTIAL/PLATELET
Basophils Absolute: 0 10*3/uL (ref 0.0–0.2)
Basos: 1 %
EOS (ABSOLUTE): 0.1 10*3/uL (ref 0.0–0.4)
Eos: 1 %
Hematocrit: 36.9 % (ref 34.0–46.6)
Hemoglobin: 12.8 g/dL (ref 11.1–15.9)
Immature Grans (Abs): 0 10*3/uL (ref 0.0–0.1)
Immature Granulocytes: 0 %
Lymphocytes Absolute: 2.2 10*3/uL (ref 0.7–3.1)
Lymphs: 36 %
MCH: 32.4 pg (ref 26.6–33.0)
MCHC: 34.7 g/dL (ref 31.5–35.7)
MCV: 93 fL (ref 79–97)
Monocytes Absolute: 0.6 10*3/uL (ref 0.1–0.9)
Monocytes: 10 %
Neutrophils Absolute: 3.3 10*3/uL (ref 1.4–7.0)
Neutrophils: 52 %
Platelets: 287 10*3/uL (ref 150–450)
RBC: 3.95 x10E6/uL (ref 3.77–5.28)
RDW: 11.8 % (ref 11.7–15.4)
WBC: 6.2 10*3/uL (ref 3.4–10.8)

## 2021-05-18 LAB — LIPID PANEL
Chol/HDL Ratio: 2.4 ratio (ref 0.0–4.4)
Cholesterol, Total: 148 mg/dL (ref 100–199)
HDL: 61 mg/dL (ref >39)
LDL Chol Calc (NIH): 76 mg/dL (ref 0–99)
Triglycerides: 50 mg/dL (ref 0–149)
VLDL Cholesterol Cal: 11 mg/dL (ref 5–40)

## 2021-05-20 ENCOUNTER — Encounter: Payer: Medicaid Other | Admitting: Certified Nurse Midwife

## 2021-06-14 ENCOUNTER — Ambulatory Visit (INDEPENDENT_AMBULATORY_CARE_PROVIDER_SITE_OTHER): Payer: Medicaid Other | Admitting: Obstetrics

## 2021-06-14 VITALS — BP 122/74 | HR 86 | Ht 66.0 in | Wt 161.7 lb

## 2021-06-14 DIAGNOSIS — Z01419 Encounter for gynecological examination (general) (routine) without abnormal findings: Secondary | ICD-10-CM

## 2021-06-14 NOTE — Progress Notes (Signed)
SUBJECTIVE ? ?HPI ? ?Anne Murphy is a 36 y.o.-year-old female who presents for an annual gynecological exam today. She is interested in discussing BTL for contraception. Her periods are somewhat irregular but she generally has one every month with normal bleeding and no dysmenorrhea. She denies pelvic pain, dyspareunia, abnormal discharge or bleeding, and UTI symptoms. She has been working towards regular exercise and improving her diet. She has no other health concerns today. ? ?Medical/Surgical History ?Past Medical History:  ?Diagnosis Date  ? Vertigo   ? ?Past Surgical History:  ?Procedure Laterality Date  ? LEG SURGERY    ? ? ?Social History ?Lives with husband and children. Feels safe. ?Work: Theatre stage manager ?Exercise: 30 min/day ?Substances: occasional EtOH. Quit smoking ~3 years ago. Denies vape and recreational drugs. ? ?Obstetric History ?OB History   ? ? Gravida  ?2  ? Para  ?2  ? Term  ?2  ? Preterm  ?   ? AB  ?   ? Living  ?2  ?  ? ? SAB  ?   ? IAB  ?   ? Ectopic  ?   ? Multiple  ?   ? Live Births  ?2  ?   ?  ?  ?  ? ?GYN/Menstrual History ?No LMP recorded (lmp unknown). ?Periods every month ?Last Pap: 04/20/18. Normal cytology, negative HPV ?Contraception: husband had vasectomy ? ?Prevention ?Endorses regular eye exams. Encouraged dental care. ?Mammogram: at 80 ? ?Current Medications ?No outpatient medications prior to visit.  ? ?No facility-administered medications prior to visit.  ?  ? ? [facility]stream - 06/14/21 0959   ? ?  ? Pregnancy Intention Screening  ? Does the patient want to become pregnant in the next year? No   ? Does the patient's partner want to become pregnant in the next year? No   ? Would the patient like to discuss contraceptive options today? No   ? ?  ?  ? ?  ? ?The pregnancy intention screening data noted above was reviewed. Potential methods of contraception were discussed. The patient elected to proceed with No data recorded.  ? ?ROS ?History obtained from the patient ?General ROS:  negative for - chills, fatigue, or night sweats ?Psychological ROS: negative for - anxiety or depression ?Ophthalmic ROS: negative for - blurry vision ?Allergy and Immunology ROS: negative for - hives or nasal congestion ?Hematological and Lymphatic ROS: negative for - bleeding problems or swollen lymph nodes ?Endocrine ROS: negative for - breast changes, mood swings, palpitations, or polydipsia/polyuria ?Breast ROS: negative for breast lumps ?Respiratory ROS: no cough, shortness of breath, or wheezing ?Cardiovascular ROS: no chest pain or dyspnea on exertion ?Gastrointestinal ROS: no abdominal pain, change in bowel habits, or black or bloody stools ?Genito-Urinary ROS: no dysuria, trouble voiding, or hematuria ?negative for - vulvar/vaginal symptoms ?Musculoskeletal ROS: positive for - stiff neck from exercise ?negative for - joint pain or muscular weakness ?Dermatological ROS: negative ? ? ?  05/17/2021  ?  9:28 AM 05/18/2020  ?  2:22 PM 04/18/2020  ?  2:28 PM 04/14/2019  ?  3:42 PM  ?Depression screen PHQ 2/9  ?Decreased Interest 0 0 0 0  ?Down, Depressed, Hopeless 0 0 0 0  ?PHQ - 2 Score 0 0 0 0  ?Altered sleeping 0  0 0  ?Tired, decreased energy 0  0 0  ?Change in appetite 0  0 0  ?Feeling bad or failure about yourself  0  1 0  ?Trouble concentrating 0  3 0  ?Moving slowly or fidgety/restless 0  0 0  ?Suicidal thoughts 0  0 0  ?PHQ-9 Score 0  4 0  ?Difficult doing work/chores Not difficult at all  Somewhat difficult Not difficult at all  ?  ? ?OBJECTIVE ? ?Last Weight  Most recent update: 06/14/2021  9:58 AM  ? ? Weight  ?73.3 kg (161 lb 11.2 oz)  ?      ? ?  ?  ?Body mass index is 26.1 kg/m?.  ? ? ?BP 122/74   Pulse 86   Ht 5\' 6"  (1.676 m)   Wt 161 lb 11.2 oz (73.3 kg)   LMP  (LMP Unknown)   BMI 26.10 kg/m?  ?General appearance: alert, cooperative, and appears stated age ?Head: Normocephalic, without obvious abnormality, atraumatic ?Eyes: negative findings: lids and lashes normal and conjunctivae and sclerae  normal ?Neck: no adenopathy, supple, symmetrical, trachea midline, and thyroid not enlarged, symmetric, no tenderness/mass/nodules ?Lungs: clear to auscultation bilaterally ?Breasts: normal appearance, no masses or tenderness ?Heart: regular rate and rhythm, S1, S2 normal, no murmur, click, rub or gallop ?Abdomen: soft, non-tender; bowel sounds normal; no masses,  no organomegaly ?Pelvic: deferred ?Extremities: extremities normal, atraumatic, no cyanosis or edema ?Pulses: 2+ and symmetric ?Skin: Skin color, texture, turgor normal. No rashes or lesions ?Lymph nodes: Cervical, supraclavicular, and axillary nodes normal. ? ?ASSESSMENT  ?1) Annual exam ?2) Desires BTL for contraception ? ?LAN ?1) Physical exam as noted. Declines STI testing. Declines routine labs but would like vitamin D level checked today. Discussed healthy lifestyle choices. Pap due in 2025. ?2) Discussed basic information regarding BTL procedure. Will schedule consult with MDs.  ? ?Return in one year for annual exam or as needed for concerns. ? ? ?2026, CNM  ?

## 2021-06-15 ENCOUNTER — Encounter: Payer: Self-pay | Admitting: Obstetrics

## 2021-06-15 ENCOUNTER — Other Ambulatory Visit: Payer: Self-pay | Admitting: Obstetrics

## 2021-06-15 LAB — VITAMIN D 25 HYDROXY (VIT D DEFICIENCY, FRACTURES): Vit D, 25-Hydroxy: 17 ng/mL — ABNORMAL LOW (ref 30.0–100.0)

## 2021-06-15 MED ORDER — VITAMIN D (ERGOCALCIFEROL) 1.25 MG (50000 UNIT) PO CAPS: 50000.0000 [IU] | ORAL_CAPSULE | ORAL | 0 refills | Status: DC

## 2021-07-30 ENCOUNTER — Encounter: Payer: Medicaid Other | Admitting: Obstetrics and Gynecology

## 2021-07-30 DIAGNOSIS — Z3009 Encounter for other general counseling and advice on contraception: Secondary | ICD-10-CM

## 2021-08-05 ENCOUNTER — Ambulatory Visit: Payer: Self-pay

## 2021-08-06 ENCOUNTER — Encounter: Payer: Self-pay | Admitting: Physician Assistant

## 2021-08-06 ENCOUNTER — Ambulatory Visit: Payer: Medicaid Other | Admitting: Physician Assistant

## 2021-08-06 VITALS — BP 116/80 | HR 91 | Temp 98.4°F | Resp 16 | Wt 161.0 lb

## 2021-08-06 DIAGNOSIS — L0291 Cutaneous abscess, unspecified: Secondary | ICD-10-CM

## 2021-08-06 NOTE — Progress Notes (Unsigned)
I,[name],acting as a Neurosurgeon for [name], PA-C.,have documented all relevant documentation on the behalf of [name], PA-C,as directed by  [name], PA-C while in the presence of [name], PA-C.    Established patient visit   Patient: Anne Murphy   DOB: [birth year]   36 y.o. Female  MRN: [id number] Visit Date: 08/06/2021  Today's healthcare provider: [name], PA-C   CC: skin abscess  Subjective    Patient presents for skin abscess / painful boil-like place on vagina / inner lip.  Onset 2 wks ago with a knot and opened Monday draining pus. Reports size of eraser. Reports she has never had a boil /cyst before.   Medications: Outpatient Medications Prior to Visit  Medication Sig   Vitamin D, Ergocalciferol, (DRISDOL) 1.25 MG (50000 UNIT) CAPS capsule Take 1 capsule (50,000 Units total) by mouth every 7 (seven) days.   No facility-administered medications prior to visit.    Review of Systems  Constitutional: Negative.   Respiratory: Negative.    Cardiovascular: Negative.   Gastrointestinal: Negative.   Musculoskeletal: Negative.   Skin:  Positive for wound.    {Labs  Heme  Chem  Endocrine  Serology  Results Review (optional):23779}   Objective    BP 116/80 (BP Location: Left Arm, Patient Position: Sitting, Cuff Size: Normal)   Pulse 91   Temp 98.4 F (36.9 C) (Oral)   Resp 16   Wt 161 lb (73 kg)   SpO2 100%   BMI 25.99 kg/m  {Show previous vital signs (optional):23777}  Physical Exam Vitals reviewed.  Constitutional:      General: She is not in acute distress.    Appearance: Normal appearance. She is well-developed. She is not diaphoretic.  HENT:     Head: Normocephalic and atraumatic.  Eyes:     General: No scleral icterus.    Conjunctiva/sclera: Conjunctivae normal.     Pupils: Pupils are equal, round, and reactive to light.  Neck:     Thyroid: No thyromegaly.  Cardiovascular:     Rate and Rhythm: Normal rate and  regular rhythm.     Pulses: Normal pulses.     Heart sounds: Normal heart sounds. No murmur heard. Pulmonary:     Effort: Pulmonary effort is normal. No respiratory distress.     Breath sounds: Normal breath sounds. No wheezing, rhonchi or rales.  Abdominal:     General: Abdomen is flat. Bowel sounds are normal.     Palpations: Abdomen is soft.  Genitourinary:    Vagina: No vaginal discharge.     Rectum: Normal.  Musculoskeletal:        General: Normal range of motion.     Cervical back: Neck supple.     Right lower leg: No edema.     Left lower leg: No edema.  Lymphadenopathy:     Cervical: No cervical adenopathy.  Skin:    General: Skin is warm and dry.     Findings: No rash.  Neurological:     General: No focal deficit present.     Mental Status: She is alert and oriented to person, place, and time. Mental status is at baseline.  Psychiatric:        Mood and Affect: Mood normal.        Behavior: Behavior normal.        Thought Content: Thought content normal.        Judgment: Judgment normal.      No results found for  any visits on 08/06/21.  Assessment & Plan     1. Abscess    The patient was advised to call back or seek an in-person evaluation if the symptoms worsen or if the condition fails to improve as anticipated.  I discussed the assessment and treatment plan with the patient. The patient was provided an opportunity to ask questions and all were answered. The patient agreed with the plan and demonstrated an understanding of the instructions.  The entirety of the information documented in the History of Present Illness, Review of Systems and Physical Exam were personally obtained by me. Portions of this information were initially documented by the CMA and reviewed by me for thoroughness and accuracy.  Portions of this note were created using dictation software and may contain typographical errors.     [name], PA-C  [facility] [phone] (phone) [phone] (fax)  [facility]

## 2021-09-19 ENCOUNTER — Other Ambulatory Visit: Payer: Self-pay | Admitting: Family Medicine

## 2021-09-19 ENCOUNTER — Telehealth: Payer: Self-pay

## 2021-09-19 ENCOUNTER — Encounter: Payer: Self-pay | Admitting: Family Medicine

## 2021-09-19 DIAGNOSIS — Z111 Encounter for screening for respiratory tuberculosis: Secondary | ICD-10-CM | POA: Diagnosis not present

## 2021-09-19 NOTE — Telephone Encounter (Signed)
Copied from CRM [id number]. Topic: Appointment Scheduling - Scheduling Inquiry for Clinic >> Sep 19, 2021  9:56 AM Lyman Speller wrote: Reason for CRM: Pt needs TB blood test / pt starts clinicals on 8.17.23/ pt would like to have this done before her clinicals / lease advise

## 2021-09-23 LAB — QUANTIFERON-TB GOLD PLUS
QuantiFERON Mitogen Value: >10 IU/mL
QuantiFERON Nil Value: 0.09 IU/mL
QuantiFERON TB1 Ag Value: 0.06 IU/mL
QuantiFERON TB2 Ag Value: 0.05 IU/mL
QuantiFERON-TB Gold Plus: NEGATIVE

## 2021-09-23 NOTE — Progress Notes (Signed)
Negative TB test.  Jacky Kindle, FNP  [facility] [address] [place], [place] [address] [phone] (phone) [phone] (fax) [facility]

## 2021-10-22 ENCOUNTER — Ambulatory Visit: Payer: Medicaid Other

## 2021-10-29 ENCOUNTER — Ambulatory Visit (INDEPENDENT_AMBULATORY_CARE_PROVIDER_SITE_OTHER): Payer: Medicaid Other

## 2021-10-29 DIAGNOSIS — Z23 Encounter for immunization: Secondary | ICD-10-CM

## 2021-11-21 DIAGNOSIS — E0590 Thyrotoxicosis, unspecified without thyrotoxic crisis or storm: Secondary | ICD-10-CM | POA: Diagnosis not present

## 2021-11-21 DIAGNOSIS — E042 Nontoxic multinodular goiter: Secondary | ICD-10-CM | POA: Diagnosis not present

## 2021-12-18 DIAGNOSIS — E0590 Thyrotoxicosis, unspecified without thyrotoxic crisis or storm: Secondary | ICD-10-CM | POA: Diagnosis not present

## 2021-12-18 DIAGNOSIS — E042 Nontoxic multinodular goiter: Secondary | ICD-10-CM | POA: Diagnosis not present

## 2022-05-20 NOTE — Progress Notes (Deleted)
Complete physical exam   Patient: Anne Murphy   DOB: 07-Aug-1985   37 y.o. Female  MRN: [id number] Visit Date: 05/21/2022  Today's healthcare provider: Jacky Kindle, FNP   No chief complaint on file.  Subjective    Anne Murphy is a 37 y.o. female who presents today for a complete physical exam.  She reports consuming a {diet types:17450} diet. {Exercise:19826} She generally feels {well/fairly well/poorly:18703}. She reports sleeping {well/fairly well/poorly:18703}. She {does/does not:200015} have additional problems to discuss today.  HPI  ***  Past Medical History:  Diagnosis Date   Vertigo    Past Surgical History:  Procedure Laterality Date   LEG SURGERY     Social History   Socioeconomic History   Marital status: Single    Spouse name: Not on file   Number of children: Not on file   Years of education: Not on file   Highest education level: Not on file  Occupational History   Not on file  Tobacco Use   Smoking status: Former    Packs/day: .5    Types: Cigarettes    Quit date: 02/11/2019    Years since quitting: 3.2   Smokeless tobacco: Never  Vaping Use   Vaping Use: Never used  Substance and Sexual Activity   Alcohol use: Yes    Comment: occasional   Drug use: No   Sexual activity: Yes    Birth control/protection: Other-see comments    Comment: Husband had a vasectomy  Other Topics Concern   Not on file  Social History Narrative   Not on file   Social Determinants of Health   Financial Resource Strain: Not on file  Food Insecurity: Not on file  Transportation Needs: Not on file  Physical Activity: Not on file  Stress: Not on file  Social Connections: Not on file  Intimate Partner Violence: Not on file   Family Status  Relation Name Status   MGM  Alive   Mother  Alive   Brother  Alive   MGF  Alive   Neg Hx  (Not Specified)   Family History  Problem Relation Age of Onset   Transient ischemic attack Maternal Grandmother     Healthy Mother    Healthy Brother    Breast cancer Neg Hx    Ovarian cancer Neg Hx    Colon cancer Neg Hx    No Known Allergies  Patient Care Team: Jacky Kindle, FNP as PCP - General (Family Medicine)   Medications: No outpatient medications prior to visit.   No facility-administered medications prior to visit.    Review of Systems  {Labs  Heme  Chem  Endocrine  Serology  Results Review (optional):23779}  Objective    There were no vitals taken for this visit. {Show previous vital signs (optional):23777}   Physical Exam  ***  Last depression screening scores    05/17/2021    9:28 AM 05/18/2020    2:22 PM 04/18/2020    2:28 PM  PHQ 2/9 Scores  PHQ - 2 Score 0 0 0  PHQ- 9 Score 0  4   Last fall risk screening    05/18/2020    2:07 PM  Fall Risk   Falls in the past year? 0  Number falls in past yr: 0  Injury with Fall? 0   Last Audit-C alcohol use screening    05/17/2021    9:28 AM  Alcohol Use Disorder Test (AUDIT)  1. How often do  you have a drink containing alcohol? 2  2. How many drinks containing alcohol do you have on a typical day when you are drinking? 0  3. How often do you have six or more drinks on one occasion? 0  AUDIT-C Score 2   A score of 3 or more in women, and 4 or more in men indicates increased risk for alcohol abuse, EXCEPT if all of the points are from question 1   No results found for any visits on 05/21/22.  Assessment & Plan    Routine Health Maintenance and Physical Exam  Exercise Activities and Dietary recommendations  Goals   None     Immunization History  Administered Date(s) Administered   Influenza,inj,Quad PF,6+ Mos 10/29/2021   Influenza-Unspecified 11/25/2019   PPD Test 09/30/2019, 10/18/2019   Tdap 10/11/2019    Health Maintenance  Topic Date Due   COVID-19 Vaccine (1) Never done   PAP SMEAR-Modifier  04/19/2021   INFLUENZA VACCINE  09/11/2022   DTaP/Tdap/Td (2 - Td or Tdap) 10/10/2029   Hepatitis C  Screening  Completed   HIV Screening  Completed   HPV VACCINES  Aged Out    Discussed health benefits of physical activity, and encouraged her to engage in regular exercise appropriate for her age and condition.  ***  No follow-ups on file.     {provider attestation***:1}   Jacky Kindle, FNP  [facility] [phone] (phone) [phone] (fax)  [facility]

## 2022-05-21 ENCOUNTER — Encounter: Payer: Medicaid Other | Admitting: Family Medicine

## 2022-05-26 NOTE — Progress Notes (Unsigned)
I,J'ya E Annielee Jemmott,acting as a scribe for Jacky Kindle, FNP.,have documented all relevant documentation on the behalf of Jacky Kindle, FNP,as directed by  Jacky Kindle, FNP while in the presence of Jacky Kindle, FNP.   Complete physical exam   Patient: Anne Murphy   DOB: 1985/07/26   37 y.o. Female  MRN: [id number] Visit Date: 05/27/2022  Today's healthcare provider: Jacky Kindle, FNP  Introduced to nurse practitioner role and practice setting.  All questions answered.  Discussed provider/patient relationship and expectations.   Chief Complaint  Patient presents with   Annual Exam   Subjective    Anne Murphy is a 37 y.o. female who presents today for a complete physical exam.  She reports consuming a general diet. The patient does not participate in regular exercise at present. She generally feels well. She reports sleeping well. She does not have additional problems to discuss today.  HPI    Past Medical History:  Diagnosis Date   Vertigo    Past Surgical History:  Procedure Laterality Date   LEG SURGERY     Social History   Socioeconomic History   Marital status: Single    Spouse name: Not on file   Number of children: Not on file   Years of education: Not on file   Highest education level: Not on file  Occupational History   Not on file  Tobacco Use   Smoking status: Former    Packs/day: .5    Types: Cigarettes    Quit date: 02/11/2019    Years since quitting: 3.2   Smokeless tobacco: Never  Vaping Use   Vaping Use: Never used  Substance and Sexual Activity   Alcohol use: Yes    Comment: occasional   Drug use: No   Sexual activity: Yes    Birth control/protection: Other-see comments    Comment: Husband had a vasectomy  Other Topics Concern   Not on file  Social History Narrative   Not on file   Social Determinants of Health   Financial Resource Strain: Not on file  Food Insecurity: Not on file  Transportation Needs: Not on file   Physical Activity: Not on file  Stress: Not on file  Social Connections: Not on file  Intimate Partner Violence: Not on file   Family Status  Relation Name Status   MGM  Alive   Mother  Alive   Brother  Alive   MGF  Alive   Neg Hx  (Not Specified)   Family History  Problem Relation Age of Onset   Transient ischemic attack Maternal Grandmother    Healthy Mother    Healthy Brother    Breast cancer Neg Hx    Ovarian cancer Neg Hx    Colon cancer Neg Hx    No Known Allergies  Patient Care Team: Jacky Kindle, FNP as PCP - General (Family Medicine)   Medications: No outpatient medications prior to visit.   No facility-administered medications prior to visit.    Review of Systems   Objective    BP 107/80 (BP Location: Left Arm, Patient Position: Sitting, Cuff Size: Normal)   Pulse 75   Temp 98.5 F (36.9 C) (Oral)   Resp 12   Ht 5\' 6"  (1.676 m)   Wt 163 lb 14.4 oz (74.3 kg)   SpO2 100%   BMI 26.45 kg/m    Physical Exam Vitals and nursing note reviewed.  Constitutional:      General:  She is awake. She is not in acute distress.    Appearance: Normal appearance. She is well-developed, well-groomed and overweight. She is not ill-appearing, toxic-appearing or diaphoretic.  HENT:     Head: Normocephalic and atraumatic.     Jaw: There is normal jaw occlusion. No trismus, tenderness, swelling or pain on movement.     Right Ear: Hearing, tympanic membrane, ear canal and external ear normal. There is no impacted cerumen.     Left Ear: Hearing, tympanic membrane, ear canal and external ear normal. There is no impacted cerumen.     Nose: Nose normal. No congestion or rhinorrhea.     Right Turbinates: Not enlarged, swollen or pale.     Left Turbinates: Not enlarged, swollen or pale.     Right Sinus: No maxillary sinus tenderness or frontal sinus tenderness.     Left Sinus: No maxillary sinus tenderness or frontal sinus tenderness.     Mouth/Throat:     Lips: Pink.      Mouth: Mucous membranes are moist. No injury.     Tongue: No lesions.     Pharynx: Oropharynx is clear. Uvula midline. No pharyngeal swelling, oropharyngeal exudate, posterior oropharyngeal erythema or uvula swelling.     Tonsils: No tonsillar exudate or tonsillar abscesses.  Eyes:     General: Lids are normal. Lids are everted, no foreign bodies appreciated. Vision grossly intact. Gaze aligned appropriately. No allergic shiner or visual field deficit.       Right eye: No discharge.        Left eye: No discharge.     Extraocular Movements: Extraocular movements intact.     Conjunctiva/sclera: Conjunctivae normal.     Right eye: Right conjunctiva is not injected. No exudate.    Left eye: Left conjunctiva is not injected. No exudate.    Pupils: Pupils are equal, round, and reactive to light.  Neck:     Thyroid: No thyroid mass, thyromegaly or thyroid tenderness.     Vascular: No carotid bruit.     Trachea: Trachea normal.  Cardiovascular:     Rate and Rhythm: Normal rate and regular rhythm.     Pulses: Normal pulses.          Carotid pulses are 2+ on the right side and 2+ on the left side.      Radial pulses are 2+ on the right side and 2+ on the left side.       Dorsalis pedis pulses are 2+ on the right side and 2+ on the left side.       Posterior tibial pulses are 2+ on the right side and 2+ on the left side.     Heart sounds: Normal heart sounds, S1 normal and S2 normal. No murmur heard.    No friction rub. No gallop.  Pulmonary:     Effort: Pulmonary effort is normal. No respiratory distress.     Breath sounds: Normal breath sounds and air entry. No stridor. No wheezing, rhonchi or rales.  Chest:     Chest wall: No tenderness.  Abdominal:     General: Abdomen is flat. Bowel sounds are normal. There is no distension.     Palpations: Abdomen is soft. There is no mass.     Tenderness: There is no abdominal tenderness. There is no right CVA tenderness, left CVA tenderness, guarding  or rebound.     Hernia: No hernia is present.  Genitourinary:    Comments: Exam deferred; denies complaints Musculoskeletal:  General: No swelling, tenderness, deformity or signs of injury. Normal range of motion.     Cervical back: Full passive range of motion without pain, normal range of motion and neck supple. No edema, rigidity or tenderness. No muscular tenderness.     Right lower leg: No edema.     Left lower leg: No edema.  Lymphadenopathy:     Cervical: No cervical adenopathy.     Right cervical: No superficial, deep or posterior cervical adenopathy.    Left cervical: No superficial, deep or posterior cervical adenopathy.  Skin:    General: Skin is warm and dry.     Capillary Refill: Capillary refill takes less than 2 seconds.     Coloration: Skin is not jaundiced or pale.     Findings: No bruising, erythema, lesion or rash.  Neurological:     General: No focal deficit present.     Mental Status: She is alert and oriented to person, place, and time. Mental status is at baseline.     GCS: GCS eye subscore is 4. GCS verbal subscore is 5. GCS motor subscore is 6.     Sensory: Sensation is intact. No sensory deficit.     Motor: Motor function is intact. No weakness.     Coordination: Coordination is intact. Coordination normal.     Gait: Gait is intact. Gait normal.  Psychiatric:        Attention and Perception: Attention and perception normal.        Mood and Affect: Mood and affect normal.        Speech: Speech normal.        Behavior: Behavior normal. Behavior is cooperative.        Thought Content: Thought content normal.        Cognition and Memory: Cognition and memory normal.        Judgment: Judgment normal.      Last depression screening scores    05/27/2022    3:54 PM 05/17/2021    9:28 AM 05/18/2020    2:22 PM  PHQ 2/9 Scores  PHQ - 2 Score 0 0 0  PHQ- 9 Score 0 0    Last fall risk screening    05/27/2022    3:54 PM  Fall Risk   Falls in the past  year? 0  Number falls in past yr: 0  Injury with Fall? 0  Risk for fall due to : No Fall Risks  Follow up Falls evaluation completed   Last Audit-C alcohol use screening    05/27/2022    3:54 PM  Alcohol Use Disorder Test (AUDIT)  1. How often do you have a drink containing alcohol? 2  2. How many drinks containing alcohol do you have on a typical day when you are drinking? 0  3. How often do you have six or more drinks on one occasion? 1  AUDIT-C Score 3   A score of 3 or more in women, and 4 or more in men indicates increased risk for alcohol abuse, EXCEPT if all of the points are from question 1   No results found for any visits on 05/27/22.  Assessment & Plan    Routine Health Maintenance and Physical Exam  Exercise Activities and Dietary recommendations  Goals   None     Immunization History  Administered Date(s) Administered   Influenza,inj,Quad PF,6+ Mos 10/29/2021   Influenza-Unspecified 11/25/2019   PPD Test 09/30/2019, 10/18/2019   Tdap 10/11/2019    Health Maintenance  Topic Date Due   COVID-19 Vaccine (1) Never done   PAP SMEAR-Modifier  04/19/2021   INFLUENZA VACCINE  09/11/2022   DTaP/Tdap/Td (2 - Td or Tdap) 10/10/2029   Hepatitis C Screening  Completed   HIV Screening  Completed   HPV VACCINES  Aged Out    Discussed health benefits of physical activity, and encouraged her to engage in regular exercise appropriate for her age and condition.  Problem List Items Addressed This Visit       Other   Annual physical exam - Primary    Due for dental Things to do to keep yourself healthy  - Exercise at least 30-45 minutes a day, 3-4 days a week.  - Eat a low-fat diet with lots of fruits and vegetables, up to 7-9 servings per day.  - Seatbelts can save your life. Wear them always.  - Smoke detectors on every level of your home, check batteries every year.  - Eye Doctor - have an eye exam every 1-2 years  - Safe sex - if you may be exposed to STDs,  use a condom.  - Alcohol -  If you drink, do it moderately, less than 2 drinks per day.  - Health Care Power of Attorney. Choose someone to speak for you if you are not able.  - Depression is common in our stressful world.If you're feeling down or losing interest in things you normally enjoy, please come in for a visit.  - Violence - If anyone is threatening or hurting you, please call immediately.       Relevant Orders   CBC with Differential/Platelet   Comprehensive Metabolic Panel (CMET)   Lipid panel   Avitaminosis D    Chronic, previously low Repeat labs today      Relevant Orders   Vitamin D (25 hydroxy)   High serum thyroid stimulating hormone (TSH)    Acute, without complaints Repeat labs       Relevant Orders   TSH + free T4   Screening for diabetes mellitus    Continue to recommend balanced, lower carb meals. Smaller meal size, adding snacks. Choosing water as drink of choice and increasing purposeful exercise. Screening given BMI >25      Relevant Orders   Hemoglobin A1c   Return in about 1 year (around 05/27/2023) for annual examination.    Anne Merl, FNP, have reviewed all documentation for this visit. The documentation on 05/27/22 for the exam, diagnosis, procedures, and orders are all accurate and complete.  Jacky Kindle, FNP  [facility] [phone] (phone) [phone] (fax)  [facility]

## 2022-05-27 ENCOUNTER — Encounter: Payer: Self-pay | Admitting: Family Medicine

## 2022-05-27 ENCOUNTER — Ambulatory Visit: Payer: Medicaid Other | Admitting: Family Medicine

## 2022-05-27 VITALS — BP 107/80 | HR 75 | Temp 98.5°F | Resp 12 | Ht 66.0 in | Wt 163.9 lb

## 2022-05-27 DIAGNOSIS — E559 Vitamin D deficiency, unspecified: Secondary | ICD-10-CM | POA: Diagnosis not present

## 2022-05-27 DIAGNOSIS — Z0000 Encounter for general adult medical examination without abnormal findings: Secondary | ICD-10-CM | POA: Diagnosis not present

## 2022-05-27 DIAGNOSIS — Z131 Encounter for screening for diabetes mellitus: Secondary | ICD-10-CM

## 2022-05-27 DIAGNOSIS — R7989 Other specified abnormal findings of blood chemistry: Secondary | ICD-10-CM | POA: Diagnosis not present

## 2022-05-27 NOTE — Assessment & Plan Note (Signed)
Due for dental Things to do to keep yourself healthy  - Exercise at least 30-45 minutes a day, 3-4 days a week.  - Eat a low-fat diet with lots of fruits and vegetables, up to 7-9 servings per day.  - Seatbelts can save your life. Wear them always.  - Smoke detectors on every level of your home, check batteries every year.  - Eye Doctor - have an eye exam every 1-2 years  - Safe sex - if you may be exposed to STDs, use a condom.  - Alcohol -  If you drink, do it moderately, less than 2 drinks per day.  - Health Care Power of Attorney. Choose someone to speak for you if you are not able.  - Depression is common in our stressful world.If you're feeling down or losing interest in things you normally enjoy, please come in for a visit.  - Violence - If anyone is threatening or hurting you, please call immediately.  

## 2022-05-27 NOTE — Assessment & Plan Note (Signed)
Acute, without complaints Repeat labs

## 2022-05-27 NOTE — Assessment & Plan Note (Signed)
Chronic, previously low Repeat labs today

## 2022-05-27 NOTE — Assessment & Plan Note (Signed)
Continue to recommend balanced, lower carb meals. Smaller meal size, adding snacks. Choosing water as drink of choice and increasing purposeful exercise. Screening given BMI >25

## 2022-05-28 ENCOUNTER — Other Ambulatory Visit: Payer: Self-pay | Admitting: Family Medicine

## 2022-05-28 ENCOUNTER — Telehealth: Payer: Self-pay

## 2022-05-28 LAB — COMPREHENSIVE METABOLIC PANEL
ALT: 17 IU/L (ref 0–32)
AST: 14 IU/L (ref 0–40)
Albumin/Globulin Ratio: 1.6 (ref 1.2–2.2)
Albumin: 4.4 g/dL (ref 3.9–4.9)
Alkaline Phosphatase: 77 IU/L (ref 44–121)
BUN/Creatinine Ratio: 19 (ref 9–23)
BUN: 13 mg/dL (ref 6–20)
Bilirubin Total: 0.3 mg/dL (ref 0.0–1.2)
CO2: 21 mmol/L (ref 20–29)
Calcium: 9.7 mg/dL (ref 8.7–10.2)
Chloride: 103 mmol/L (ref 96–106)
Creatinine, Ser: 0.69 mg/dL (ref 0.57–1.00)
Globulin, Total: 2.7 g/dL (ref 1.5–4.5)
Glucose: 88 mg/dL (ref 70–99)
Potassium: 4.4 mmol/L (ref 3.5–5.2)
Sodium: 140 mmol/L (ref 134–144)
Total Protein: 7.1 g/dL (ref 6.0–8.5)
eGFR: 115 mL/min/{1.73_m2} (ref >59)

## 2022-05-28 LAB — LIPID PANEL
Chol/HDL Ratio: 2.6 ratio (ref 0.0–4.4)
Cholesterol, Total: 155 mg/dL (ref 100–199)
HDL: 60 mg/dL (ref >39)
LDL Chol Calc (NIH): 79 mg/dL (ref 0–99)
Triglycerides: 85 mg/dL (ref 0–149)
VLDL Cholesterol Cal: 16 mg/dL (ref 5–40)

## 2022-05-28 LAB — CBC WITH DIFFERENTIAL/PLATELET
Basophils Absolute: 0 10*3/uL (ref 0.0–0.2)
Basos: 1 %
EOS (ABSOLUTE): 0.1 10*3/uL (ref 0.0–0.4)
Eos: 2 %
Hematocrit: 38.7 % (ref 34.0–46.6)
Hemoglobin: 12.9 g/dL (ref 11.1–15.9)
Immature Grans (Abs): 0 10*3/uL (ref 0.0–0.1)
Immature Granulocytes: 0 %
Lymphocytes Absolute: 2.9 10*3/uL (ref 0.7–3.1)
Lymphs: 37 %
MCH: 31.8 pg (ref 26.6–33.0)
MCHC: 33.3 g/dL (ref 31.5–35.7)
MCV: 95 fL (ref 79–97)
Monocytes Absolute: 0.8 10*3/uL (ref 0.1–0.9)
Monocytes: 10 %
Neutrophils Absolute: 4 10*3/uL (ref 1.4–7.0)
Neutrophils: 50 %
Platelets: 363 10*3/uL (ref 150–450)
RBC: 4.06 x10E6/uL (ref 3.77–5.28)
RDW: 11.7 % (ref 11.7–15.4)
WBC: 7.8 10*3/uL (ref 3.4–10.8)

## 2022-05-28 LAB — TSH+FREE T4
Free T4: 1.24 ng/dL (ref 0.82–1.77)
TSH: 0.592 u[IU]/mL (ref 0.450–4.500)

## 2022-05-28 LAB — HEMOGLOBIN A1C
Est. average glucose Bld gHb Est-mCnc: 108 mg/dL
Hgb A1c MFr Bld: 5.4 % (ref 4.8–5.6)

## 2022-05-28 LAB — VITAMIN D 25 HYDROXY (VIT D DEFICIENCY, FRACTURES): Vit D, 25-Hydroxy: 12 ng/mL — ABNORMAL LOW (ref 30.0–100.0)

## 2022-05-28 MED ORDER — VITAMIN D3 125 MCG (5000 UT) PO CAPS: 5000.0000 [IU] | ORAL_CAPSULE | Freq: Every day | ORAL | 3 refills | Status: AC

## 2022-05-28 MED ORDER — VITAMIN D (ERGOCALCIFEROL) 1.25 MG (50000 UNIT) PO CAPS: 50000.0000 [IU] | ORAL_CAPSULE | ORAL | 3 refills | Status: DC

## 2022-05-28 NOTE — Telephone Encounter (Addendum)
-----   Message from Jacky Kindle, FNP sent at 05/28/2022  7:59 AM EDT ----- Vit D remains low; recommend restart of supplement at 5000 IU daily OTC of Rx 50,000 weekly.   Other labs are normal and stable.

## 2022-05-28 NOTE — Telephone Encounter (Signed)
Disregard previous note pertaining to "patient called patient aware". For clarification, I did NOT speak with Vernard Gambles, I accidentally copied it onto her chart.

## 2022-05-28 NOTE — Progress Notes (Signed)
Vit D remains low; recommend restart of supplement at 5000 IU daily OTC of Rx 50,000 weekly.   Other labs are normal and stable.

## 2022-06-14 NOTE — Progress Notes (Unsigned)
ANNUAL GYNECOLOGICAL EXAM  SUBJECTIVE  HPI  Anne Murphy is a 37 y.o.-year-old G2P2002 who presents for an annual gynecological exam today.  She denies pelvic pain, dyspareunia, abnormal vaginal bleeding or discharge, and UTI symptoms. She has no other health concerns. She had considered getting a BTL for contraception, but her husband got a vasectomy instead.  Medical/Surgical History Past Medical History:  Diagnosis Date   Vertigo    Past Surgical History:  Procedure Laterality Date   LEG SURGERY      Social History Lives with husband and children. 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 Former smoker - denies current tobacco, vape, and recreational drugs  Obstetric History OB History     Gravida  2   Para  2   Term  2   Preterm      AB      Living  2      SAB      IAB      Ectopic      Multiple      Live Births  2            GYN/Menstrual History No LMP recorded. Regular monthly periods Last Pap: 04/2018. NILM, negative HPV Contraception: vasectomy  Prevention Mammogram: at 40 Colonoscopy: at 25  Current Medications Outpatient Medications Prior to Visit  Medication Sig   Cholecalciferol (VITAMIN D3) 125 MCG (5000 UT) CAPS Take 1 capsule (5,000 Units total) by mouth daily.   Vitamin D, Ergocalciferol, (DRISDOL) 1.25 MG (50000 UNIT) CAPS capsule Take 1 capsule (50,000 Units total) by mouth every 7 (seven) days.   No facility-administered medications prior to visit.        ROS Constitutional: Denied constitutional symptoms, night sweats, recent illness, fatigue, fever, insomnia and weight loss.  Eyes: Denied eye symptoms, eye pain, photophobia, vision change and visual disturbance.  Ears/Nose/Throat/Neck: Denied ear, nose, throat or neck symptoms, hearing loss, nasal discharge, sinus congestion and sore throat.  Cardiovascular: Denied  cardiovascular symptoms, arrhythmia, chest pain/pressure, edema, exercise intolerance, orthopnea and palpitations.  Respiratory: Denied pulmonary symptoms, asthma, pleuritic pain, productive sputum, cough, dyspnea and wheezing.  Gastrointestinal: Denied, gastro-esophageal reflux, melena, nausea and vomiting.  Genitourinary: Denied genitourinary symptoms including symptomatic vaginal discharge, pelvic relaxation issues, and urinary complaints.  Musculoskeletal: Denied musculoskeletal symptoms, stiffness, swelling, muscle weakness and myalgia.  Dermatologic: Denied dermatology symptoms, rash and scar.  Neurologic: Denied neurology symptoms, dizziness, headache, neck pain and syncope.  Psychiatric: Denied psychiatric symptoms, anxiety and depression.  Endocrine: Denied endocrine symptoms including hot flashes and night sweats.    OBJECTIVE  There were no vitals taken for this visit.   Physical examination General NAD, Conversant  HEENT Atraumatic; Op clear with mmm.  Normo-cephalic. Pupils reactive. Anicteric sclerae  Thyroid/Neck Smooth without nodularity or enlargement. Normal ROM.  Neck Supple.  Skin No rashes, lesions or ulceration. Normal palpated skin turgor. No nodularity.  Breasts: No masses or discharge.  Symmetric.  No axillary adenopathy.  Lungs: Clear to auscultation.No rales or wheezes. Normal Respiratory effort, no retractions.  Heart: NSR.  No murmurs or rubs appreciated. No peripheral edema  Abdomen: Soft.  Non-tender.  No masses.  No HSM. No hernia  Extremities: Moves all appropriately.  Normal ROM for age. No lymphadenopathy.  Neuro: Oriented to PPT.  Normal mood. Normal affect.     Pelvic: declined    ASSESSMENT  1) Annual exam  PLAN 1) Physical exam  as noted. Discussed healthy lifestyle choices and preventive care. Declines STI testing. Labs done with PCP. Encouraged vitamin D supplement. Pap due next year.   Return in one year for annual exam or as needed for  concerns.   Guadlupe Spanish, CNM

## 2022-06-16 ENCOUNTER — Encounter: Payer: Self-pay | Admitting: Obstetrics

## 2022-06-16 ENCOUNTER — Ambulatory Visit (INDEPENDENT_AMBULATORY_CARE_PROVIDER_SITE_OTHER): Payer: Medicaid Other | Admitting: Obstetrics

## 2022-06-16 VITALS — Ht 66.0 in | Wt 171.0 lb

## 2022-06-16 DIAGNOSIS — Z01419 Encounter for gynecological examination (general) (routine) without abnormal findings: Secondary | ICD-10-CM | POA: Diagnosis not present

## 2022-06-18 ENCOUNTER — Encounter: Payer: Self-pay | Admitting: Obstetrics

## 2022-12-17 DIAGNOSIS — E042 Nontoxic multinodular goiter: Secondary | ICD-10-CM | POA: Diagnosis not present

## 2022-12-17 DIAGNOSIS — E0590 Thyrotoxicosis, unspecified without thyrotoxic crisis or storm: Secondary | ICD-10-CM | POA: Diagnosis not present

## 2022-12-24 DIAGNOSIS — R7989 Other specified abnormal findings of blood chemistry: Secondary | ICD-10-CM | POA: Diagnosis not present

## 2022-12-24 DIAGNOSIS — E042 Nontoxic multinodular goiter: Secondary | ICD-10-CM | POA: Diagnosis not present

## 2023-02-09 ENCOUNTER — Other Ambulatory Visit: Payer: Self-pay

## 2023-02-09 ENCOUNTER — Encounter: Payer: Self-pay | Admitting: *Deleted

## 2023-02-09 ENCOUNTER — Ambulatory Visit
Admission: EM | Admit: 2023-02-09 | Discharge: 2023-02-09 | Disposition: A | Discharge status: Home / Self Care | Payer: 59 | Attending: Emergency Medicine | Admitting: Emergency Medicine

## 2023-02-09 DIAGNOSIS — R300 Dysuria: Secondary | ICD-10-CM | POA: Insufficient documentation

## 2023-02-09 DIAGNOSIS — J069 Acute upper respiratory infection, unspecified: Secondary | ICD-10-CM | POA: Insufficient documentation

## 2023-02-09 LAB — POCT URINALYSIS DIP (MANUAL ENTRY)
Bilirubin, UA: NEGATIVE
Glucose, UA: NEGATIVE mg/dL
Ketones, POC UA: NEGATIVE mg/dL
Nitrite, UA: NEGATIVE
Protein Ur, POC: NEGATIVE mg/dL
Spec Grav, UA: 1.01
Urobilinogen, UA: 0.2 U/dL
pH, UA: 7

## 2023-02-09 MED ORDER — NITROFURANTOIN MONOHYD MACRO 100 MG PO CAPS: 100.0000 mg | ORAL_CAPSULE | Freq: Two times a day (BID) | ORAL | 0 refills | Status: DC

## 2023-02-09 MED ORDER — BENZONATATE 100 MG PO CAPS: 100.0000 mg | ORAL_CAPSULE | Freq: Three times a day (TID) | ORAL | 0 refills | Status: DC

## 2023-02-09 NOTE — Discharge Instructions (Addendum)
Your urinalysis does not show infection, your urine will be sent to the lab to determine exactly which bacteria is present, if any changes need to be made to your medications you will be notified  Begin use of Macrobid twice a day for 5 days   You may use over-the-counter Azo to help minimize your symptoms until antibiotic removes bacteria, this medication will turn your urine orange  Increase your fluid intake through use of water  As always practice good hygiene, wiping front to back and avoidance of scented vaginal products to prevent further irritation  If symptoms continue to persist after use of medication or recur please follow-up with urgent care or your primary doctor as needed  Your symptoms today are most likely being caused by a virus and should steadily improve in time it can take up to 7 to 10 days before you truly start to see a turnaround however things will get better  Use Tessalon pill every 8 hours as needed to help calm your coughing    You can take Tylenol and/or Ibuprofen as needed for fever reduction and pain relief.   For cough: honey 1/2 to 1 teaspoon (you can dilute the honey in water or another fluid).  You can also use guaifenesin and dextromethorphan for cough. You can use a humidifier for chest congestion and cough.  If you don't have a humidifier, you can sit in the bathroom with the hot shower running.      For sore throat: try warm salt water gargles, cepacol lozenges, throat spray, warm tea or water with lemon/honey, popsicles or ice, or OTC cold relief medicine for throat discomfort.   For congestion: take a daily anti-histamine like Zyrtec, Claritin, and a oral decongestant, such as pseudoephedrine.  You can also use Flonase 1-2 sprays in each nostril daily.   It is important to stay hydrated: drink plenty of fluids (water, gatorade/powerade/pedialyte, juices, or teas) to keep your throat moisturized and help further relieve irritation/discomfort.

## 2023-02-09 NOTE — ED Triage Notes (Signed)
PT report she started feeling sick on 12-26.Pt has a cough ,body aches and dysuria

## 2023-02-09 NOTE — ED Provider Notes (Signed)
[name]    CSN: [id number] Arrival date & time: 02/09/23  1100      History   Chief Complaint Chief Complaint  Patient presents with   Dysuria   Generalized Body Aches   Cough    HPI Anne Murphy is a 37 y.o. female.   Presents for evaluation of nasal congestion, rhinorrhea, intermittent sinus pressure, intermittent generalized headaches and sore throat present for 4 days.  Sore throat has resolved.  Has been taking Sudafed and attempted use of Tussidex.  No known sick contacts prior.  Denies respiratory history.  Denies fever, shortness of breath, wheezing or abdominal symptoms.  Patient concerned with dysuria and bilateral lower back pain present for 4 days.  Denies frequency, urgency, hematuria, abdominal pain, fever or vaginal symptoms.  Past Medical History:  Diagnosis Date   Vertigo     Patient Active Problem List   Diagnosis Date Noted   Avitaminosis D 05/27/2022   High serum thyroid stimulating hormone (TSH) 05/27/2022   Screening for diabetes mellitus 05/27/2022   Annual physical exam 05/17/2021    Past Surgical History:  Procedure Laterality Date   LEG SURGERY      OB History     Gravida  2   Para  2   Term  2   Preterm      AB      Living  2      SAB      IAB      Ectopic      Multiple      Live Births  2            Home Medications    Prior to Admission medications   Medication Sig Start Date End Date Taking? Authorizing Provider  benzonatate (TESSALON) 100 MG capsule Take 1 capsule (100 mg total) by mouth every 8 (eight) hours. 02/09/23  Yes Ariyanah Aguado R, NP  Cholecalciferol (VITAMIN D3) 125 MCG (5000 UT) CAPS Take 1 capsule (5,000 Units total) by mouth daily. Patient not taking: Reported on 06/16/2022 05/28/22   Jacky Kindle, FNP  nitrofurantoin, macrocrystal-monohydrate, (MACROBID) 100 MG capsule Take 1 capsule (100 mg total) by mouth 2 (two) times daily. 02/09/23  Yes Domonic Hiscox R, NP   Vitamin D, Ergocalciferol, (DRISDOL) 1.25 MG (50000 UNIT) CAPS capsule Take 1 capsule (50,000 Units total) by mouth every 7 (seven) days. Patient not taking: Reported on 06/16/2022 05/28/22   Jacky Kindle, FNP    Family History Family History  Problem Relation Age of Onset   Transient ischemic attack Maternal Grandmother    Healthy Mother    Healthy Brother    Breast cancer Neg Hx    Ovarian cancer Neg Hx    Colon cancer Neg Hx     Social History Social History   Tobacco Use   Smoking status: Former    Current packs/day: 0.00    Types: Cigarettes    Quit date: 02/11/2019    Years since quitting: 3.9   Smokeless tobacco: Never  Vaping Use   Vaping status: Never Used  Substance Use Topics   Alcohol use: Yes    Comment: occasional   Drug use: No     Allergies   Patient has no known allergies.   Review of Systems Review of Systems   Physical Exam Triage Vital Signs ED Triage Vitals  Encounter Vitals Group     BP 02/09/23 1226 111/79     Systolic BP Percentile --  Diastolic BP Percentile --      Pulse Rate 02/09/23 1226 99     Resp 02/09/23 1226 20     Temp 02/09/23 1226 99 F (37.2 C)     Temp src --      SpO2 02/09/23 1226 97 %     Weight --      Height --      Head Circumference --      Peak Flow --      Pain Score 02/09/23 1223 3     Pain Loc --      Pain Education --      Exclude from Growth Chart --    No data found.  Updated Vital Signs BP 111/79   Pulse 99   Temp 99 F (37.2 C)   Resp 20   LMP 01/26/2023   SpO2 97%   Visual Acuity Right Eye Distance:   Left Eye Distance:   Bilateral Distance:    Right Eye Near:   Left Eye Near:    Bilateral Near:     Physical Exam Constitutional:      Appearance: Normal appearance.  HENT:     Right Ear: Tympanic membrane, ear canal and external ear normal.     Left Ear: Tympanic membrane, ear canal and external ear normal.     Nose: Congestion present. No rhinorrhea.     Mouth/Throat:      Pharynx: No oropharyngeal exudate or posterior oropharyngeal erythema.  Eyes:     Extraocular Movements: Extraocular movements intact.  Cardiovascular:     Rate and Rhythm: Normal rate and regular rhythm.     Pulses: Normal pulses.     Heart sounds: Normal heart sounds.  Pulmonary:     Effort: Pulmonary effort is normal.     Breath sounds: Normal breath sounds.  Abdominal:     General: Abdomen is flat. Bowel sounds are normal.     Palpations: Abdomen is soft.     Tenderness: There is no right CVA tenderness or left CVA tenderness.  Neurological:     Mental Status: She is alert.      UC Treatments / Results  Labs (all labs ordered are listed, but only abnormal results are displayed) Labs Reviewed  POCT URINALYSIS DIP (MANUAL ENTRY) - Abnormal; Notable for the following components:      Result Value   Blood, UA small (*)    Leukocytes, UA Small (1+) (*)    All other components within normal limits  URINE CULTURE    EKG   Radiology No results found.  Procedures Procedures (including critical care time)  Medications Ordered in UC Medications - No data to display  Initial Impression / Assessment and Plan / UC Course  I have reviewed the triage vital signs and the nursing notes.  Pertinent labs & imaging results that were available during my care of the patient were reviewed by me and considered in my medical decision making (see chart for details).  Viral URI with cough, dysuria  Patient is in no signs of distress nor toxic appearing.  Vital signs are stable.  Low suspicion for pneumonia, pneumothorax or bronchitis and therefore will defer imaging.  Prescribed Tessalon.May use additional over-the-counter medications as needed for supportive care.  May follow-up with urgent care as needed if symptoms persist or worsen.  Alysis showing leukocytes, negative for nitrates, sent for culture, prophylactically initiating antibiotic as she is symptomatic, Macrobid prescribed,  recommended additional supportive measures with follow-up as needed.  Final Clinical Impressions(s) / UC Diagnoses   Final diagnoses:  Dysuria  Viral URI with cough     Discharge Instructions      Your urinalysis does not show infection, your urine will be sent to the lab to determine exactly which bacteria is present, if any changes need to be made to your medications you will be notified  Begin use of Macrobid twice a day for 5 days   You may use over-the-counter Azo to help minimize your symptoms until antibiotic removes bacteria, this medication will turn your urine orange  Increase your fluid intake through use of water  As always practice good hygiene, wiping front to back and avoidance of scented vaginal products to prevent further irritation  If symptoms continue to persist after use of medication or recur please follow-up with urgent care or your primary doctor as needed  Your symptoms today are most likely being caused by a virus and should steadily improve in time it can take up to 7 to 10 days before you truly start to see a turnaround however things will get better  Use Tessalon pill every 8 hours as needed to help calm your coughing    You can take Tylenol and/or Ibuprofen as needed for fever reduction and pain relief.   For cough: honey 1/2 to 1 teaspoon (you can dilute the honey in water or another fluid).  You can also use guaifenesin and dextromethorphan for cough. You can use a humidifier for chest congestion and cough.  If you don't have a humidifier, you can sit in the bathroom with the hot shower running.      For sore throat: try warm salt water gargles, cepacol lozenges, throat spray, warm tea or water with lemon/honey, popsicles or ice, or OTC cold relief medicine for throat discomfort.   For congestion: take a daily anti-histamine like Zyrtec, Claritin, and a oral decongestant, such as pseudoephedrine.  You can also use Flonase 1-2 sprays in each nostril  daily.   It is important to stay hydrated: drink plenty of fluids (water, gatorade/powerade/pedialyte, juices, or teas) to keep your throat moisturized and help further relieve irritation/discomfort.    ED Prescriptions     Medication Sig Dispense Auth. Provider   nitrofurantoin, macrocrystal-monohydrate, (MACROBID) 100 MG capsule Take 1 capsule (100 mg total) by mouth 2 (two) times daily. 10 capsule Kinjal Neitzke R, NP   benzonatate (TESSALON) 100 MG capsule Take 1 capsule (100 mg total) by mouth every 8 (eight) hours. 21 capsule Chantilly Linskey, Elita Boone, NP      PDMP not reviewed this encounter.   Valinda Hoar, NP 02/09/23 1242

## 2023-02-11 LAB — URINE CULTURE: Culture: >=100000 — AB

## 2023-09-01 ENCOUNTER — Ambulatory Visit: Admitting: Family Medicine

## 2023-09-01 VITALS — BP 121/75 | HR 83 | Ht 66.0 in | Wt 177.0 lb

## 2023-09-01 DIAGNOSIS — Z13220 Encounter for screening for lipoid disorders: Secondary | ICD-10-CM

## 2023-09-01 DIAGNOSIS — Z131 Encounter for screening for diabetes mellitus: Secondary | ICD-10-CM | POA: Diagnosis not present

## 2023-09-01 DIAGNOSIS — Z23 Encounter for immunization: Secondary | ICD-10-CM

## 2023-09-01 DIAGNOSIS — Z130 Encounter for screening for diseases of the blood and blood-forming organs and certain disorders involving the immune mechanism: Secondary | ICD-10-CM | POA: Diagnosis not present

## 2023-09-01 DIAGNOSIS — E559 Vitamin D deficiency, unspecified: Secondary | ICD-10-CM | POA: Diagnosis not present

## 2023-09-01 DIAGNOSIS — E663 Overweight: Secondary | ICD-10-CM | POA: Diagnosis not present

## 2023-09-01 DIAGNOSIS — Z0000 Encounter for general adult medical examination without abnormal findings: Secondary | ICD-10-CM | POA: Diagnosis not present

## 2023-09-01 NOTE — Progress Notes (Signed)
 Complete physical exam   Patient: Anne Murphy   DOB: 21-Sep-1985   38 y.o. Female  MRN: [id number] Visit Date: 09/01/2023  Today's healthcare provider: Rockie Agent, MD   Chief Complaint  Patient presents with   Annual Exam    Pattern of eating:General   Are you exercising:Yes  What type of exercising:Gym  How long: 1 hour  How frequent: 3 times a week    Vaccine: agreed to vaccines      Subjective    Anne Murphy is a 38 y.o. female who presents today for a complete physical exam.    She {does/does not:200015} have additional problems to discuss today.   Discussed the use of AI scribe software for clinical note transcription with the patient, who gave verbal consent to proceed.  History of Present Illness      Past Medical History:  Diagnosis Date   Vertigo    Past Surgical History:  Procedure Laterality Date   LEG SURGERY     Social History   Socioeconomic History   Marital status: Single    Spouse name: Not on file   Number of children: Not on file   Years of education: Not on file   Highest education level: Bachelor's degree (e.g., BA, AB, BS)  Occupational History   Not on file  Tobacco Use   Smoking status: Former    Current packs/day: 0.00    Types: Cigarettes    Quit date: 02/11/2019    Years since quitting: 4.5   Smokeless tobacco: Never  Vaping Use   Vaping status: Never Used  Substance and Sexual Activity   Alcohol use: Yes    Comment: occasional   Drug use: No   Sexual activity: Yes    Birth control/protection: Other-see comments    Comment: Husband had a vasectomy  Other Topics Concern   Not on file  Social History Narrative   Not on file   Social Drivers of Health   Financial Resource Strain: Low Risk  (09/01/2023)   Overall Financial Resource Strain (CARDIA)    Difficulty of Paying Living Expenses: Not very hard  Food Insecurity: No Food Insecurity (09/01/2023)   Hunger Vital Sign    Worried About  Running Out of Food in the Last Year: Never true    Ran Out of Food in the Last Year: Never true  Transportation Needs: No Transportation Needs (09/01/2023)   PRAPARE - Administrator, Civil Service (Medical): No    Lack of Transportation (Non-Medical): No  Physical Activity: Insufficiently Active (09/01/2023)   Exercise Vital Sign    Days of Exercise per Week: 3 days    Minutes of Exercise per Session: 30 min  Stress: Stress Concern Present (09/01/2023)   [employer] of Occupational Health - Occupational Stress Questionnaire    Feeling of Stress: To some extent  Social Connections: Moderately Isolated (09/01/2023)   Social Connection and Isolation Panel    Frequency of Communication with Friends and Family: More than three times a week    Frequency of Social Gatherings with Friends and Family: Twice a week    Attends Religious Services: Never    Database administrator or Organizations: No    Attends Engineer, structural: Not on file    Marital Status: Living with partner  Intimate Partner Violence: Not on file   Family Status  Relation Name Status   MGM  Alive   Mother  Alive   Brother  Alive   MGF  Alive   Neg Hx  (Not Specified)  No partnership data on file   Family History  Problem Relation Age of Onset   Transient ischemic attack Maternal Grandmother    Healthy Mother    Healthy Brother    Breast cancer Neg Hx    Ovarian cancer Neg Hx    Colon cancer Neg Hx    No Known Allergies   Medications: Outpatient Medications Prior to Visit  Medication Sig   Cholecalciferol (VITAMIN D3) 125 MCG (5000 UT) CAPS Take 1 capsule (5,000 Units total) by mouth daily. (Patient not taking: Reported on 06/16/2022)   Vitamin D , Ergocalciferol , (DRISDOL ) 1.25 MG (50000 UNIT) CAPS capsule Take 1 capsule (50,000 Units total) by mouth every 7 (seven) days. (Patient not taking: Reported on 06/16/2022)   benzonatate  (TESSALON ) 100 MG capsule Take 1 capsule (100 mg total) by  mouth every 8 (eight) hours.   nitrofurantoin , macrocrystal-monohydrate, (MACROBID ) 100 MG capsule Take 1 capsule (100 mg total) by mouth 2 (two) times daily.   No facility-administered medications prior to visit.    Review of Systems  {Insert previous labs (optional):23779} {See past labs  Heme  Chem  Endocrine  Serology  Results Review (optional):1}  Objective    BP 121/75   Pulse 83   Ht 5' 6 (1.676 m)   Wt 177 lb (80.3 kg)   SpO2 99%   BMI 28.57 kg/m  {Insert last BP/Wt (optional):23777}{See vitals history (optional):1}    Physical Exam  ***  Last depression screening scores    09/01/2023    3:42 PM 05/27/2022    3:54 PM 05/17/2021    9:28 AM  PHQ 2/9 Scores  PHQ - 2 Score 2 0 0  PHQ- 9 Score 10 0 0    Last fall risk screening    05/27/2022    3:54 PM  Fall Risk   Falls in the past year? 0  Number falls in past yr: 0  Injury with Fall? 0  Risk for fall due to : No Fall Risks  Follow up Falls evaluation completed    Last Audit-C alcohol use screening    09/01/2023    3:05 PM  Alcohol Use Disorder Test (AUDIT)  1. How often do you have a drink containing alcohol? 3  2. How many drinks containing alcohol do you have on a typical day when you are drinking? 0  3. How often do you have six or more drinks on one occasion? 1  AUDIT-C Score 4   4. How often during the last year have you found that you were not able to stop drinking once you had started? 0  5. How often during the last year have you failed to do what was normally expected from you because of drinking? 0  6. How often during the last year have you needed a first drink in the morning to get yourself going after a heavy drinking session? 0  7. How often during the last year have you had a feeling of guilt of remorse after drinking? 0  8. How often during the last year have you been unable to remember what happened the night before because you had been drinking? 0  9. Have you or someone else been  injured as a result of your drinking? 0  10. Has a relative or friend or a doctor or another health worker been concerned about your drinking or suggested you cut down? 0  Alcohol Use  Disorder Identification Test Final Score (AUDIT) 4      Patient-reported   A score of 3 or more in women, and 4 or more in men indicates increased risk for alcohol abuse, EXCEPT if all of the points are from question 1   No results found for any visits on 09/01/23.  Assessment & Plan    Routine Health Maintenance and Physical Exam  Immunization History  Administered Date(s) Administered   Influenza, Quadrivalent, Recombinant, Inj, Pf 12/09/2018   Influenza,inj,Quad PF,6+ Mos 10/29/2021   Influenza-Unspecified 11/25/2019, 10/09/2020   PPD Test 09/30/2019, 10/18/2019   Tdap 10/11/2019    Health Maintenance  Topic Date Due   Hepatitis B Vaccines (1 of 3 - 19+ 3-dose series) Never done   HPV VACCINES (1 - 3-dose SCDM series) Never done   COVID-19 Vaccine (1 - 2024-25 season) Never done   Cervical Cancer Screening (HPV/Pap Cotest)  04/20/2023   INFLUENZA VACCINE  09/11/2023   DTaP/Tdap/Td (2 - Td or Tdap) 10/10/2029   Hepatitis C Screening  Completed   HIV Screening  Completed   Meningococcal B Vaccine  Aged Out    Problem List Items Addressed This Visit   None   Assessment and Plan Assessment & Plan        No follow-ups on file.       Rockie Agent, MD  [facility] [phone] (phone) [phone] (fax)  [facility]

## 2023-09-02 LAB — CBC
Hematocrit: 39.2 % (ref 34.0–46.6)
Hemoglobin: 12.8 g/dL (ref 11.1–15.9)
MCH: 31.6 pg (ref 26.6–33.0)
MCHC: 32.7 g/dL (ref 31.5–35.7)
MCV: 97 fL (ref 79–97)
Platelets: 330 x10E3/uL (ref 150–450)
RBC: 4.05 x10E6/uL (ref 3.77–5.28)
RDW: 11.8 % (ref 11.7–15.4)
WBC: 7.7 x10E3/uL (ref 3.4–10.8)

## 2023-09-02 LAB — CMP14+EGFR
ALT: 16 IU/L (ref 0–32)
AST: 17 IU/L (ref 0–40)
Albumin: 4.3 g/dL (ref 3.9–4.9)
Alkaline Phosphatase: 72 IU/L (ref 44–121)
BUN/Creatinine Ratio: 14 (ref 9–23)
BUN: 12 mg/dL (ref 6–20)
Bilirubin Total: 0.2 mg/dL (ref 0.0–1.2)
CO2: 23 mmol/L (ref 20–29)
Calcium: 9.5 mg/dL (ref 8.7–10.2)
Chloride: 104 mmol/L (ref 96–106)
Creatinine, Ser: 0.87 mg/dL (ref 0.57–1.00)
Globulin, Total: 2.7 g/dL (ref 1.5–4.5)
Glucose: 97 mg/dL (ref 70–99)
Potassium: 4.1 mmol/L (ref 3.5–5.2)
Sodium: 139 mmol/L (ref 134–144)
Total Protein: 7 g/dL (ref 6.0–8.5)
eGFR: 87 mL/min/1.73 (ref >59)

## 2023-09-02 LAB — LIPID PANEL
Chol/HDL Ratio: 2.9 ratio (ref 0.0–4.4)
Cholesterol, Total: 168 mg/dL (ref 100–199)
HDL: 57 mg/dL (ref >39)
LDL Chol Calc (NIH): 94 mg/dL (ref 0–99)
Triglycerides: 92 mg/dL (ref 0–149)
VLDL Cholesterol Cal: 17 mg/dL (ref 5–40)

## 2023-09-02 LAB — HEMOGLOBIN A1C
Est. average glucose Bld gHb Est-mCnc: 105 mg/dL
Hgb A1c MFr Bld: 5.3 % (ref 4.8–5.6)

## 2023-09-02 LAB — VITAMIN D 25 HYDROXY (VIT D DEFICIENCY, FRACTURES): Vit D, 25-Hydroxy: 27.7 ng/mL — ABNORMAL LOW (ref 30.0–100.0)

## 2023-09-03 ENCOUNTER — Encounter: Payer: Self-pay | Admitting: Family Medicine

## 2023-09-07 ENCOUNTER — Ambulatory Visit: Payer: Self-pay | Admitting: Family Medicine

## 2023-09-10 NOTE — Progress Notes (Unsigned)
 ANNUAL GYNECOLOGICAL EXAM  SUBJECTIVE  HPI  Anne Murphy is a 38 y.o.-year-old G2P2002 who presents for an annual gynecological exam today.  She denies pelvic pain, dyspareunia, abnormal vaginal bleeding or discharge, and UTI symptoms. She has noticed some increased bleeding with her past two periods. She feels concerned about increasing difficulty with concentration and would like ot be evaluated for ADHD. She is also concerned about difficulty losing weight.  Medical/Surgical History Past Medical History:  Diagnosis Date   Vertigo    Past Surgical History:  Procedure Laterality Date   LEG SURGERY      Social History Lives with partner and children. Feels safe there Work: L&D RN @ [employer] Exercise: gym, weightlifting Substances: EtOH 1-1.5 drinks a few times a week; denies tobacco, vape, and recreational drugs  Obstetric History OB History     Gravida  2   Para  2   Term  2   Preterm      AB      Living  2      SAB      IAB      Ectopic      Multiple      Live Births  2            GYN/Menstrual History Patient's last menstrual period was 09/03/2023 (exact date). Regular periods every 21 days Last Pap: 04/2018. NILM, negative HPV Contraception: vasectomy  Prevention Dentist: regular visits Eye exam: recent eye exam Mammogram: at 40 Colonoscopy: at 45  Current Medications Outpatient Medications Prior to Visit  Medication Sig   Cholecalciferol (VITAMIN D3) 125 MCG (5000 UT) CAPS Take 1 capsule (5,000 Units total) by mouth daily.   Multiple Vitamins-Calcium (ONE-A-DAY WOMENS FORMULA) TABS Take by mouth.   [DISCONTINUED] benzonatate  (TESSALON ) 100 MG capsule Take 1 capsule (100 mg total) by mouth every 8 (eight) hours.   [DISCONTINUED] nitrofurantoin , macrocrystal-monohydrate, (MACROBID ) 100 MG capsule Take 1 capsule (100 mg total) by mouth 2 (two) times daily.   [DISCONTINUED] Vitamin D , Ergocalciferol , (DRISDOL ) 1.25 MG (50000 UNIT) CAPS  capsule Take 1 capsule (50,000 Units total) by mouth every 7 (seven) days. (Patient not taking: Reported on 06/16/2022)   No facility-administered medications prior to visit.      Upstream - 09/11/23 1333       Pregnancy Intention Screening   Does the patient want to become pregnant in the next year? N/A    Does the patient's partner want to become pregnant in the next year? N/A    Would the patient like to discuss contraceptive options today? N/A      Contraception Wrap Up   Current Method Vasectomy    End Method Vasectomy    Contraception Counseling Provided No           ROS Constitutional: Denied constitutional symptoms, night sweats, recent illness, fatigue, fever, insomnia. + weight loss  Eyes: Denied eye symptoms, eye pain, photophobia, vision change and visual disturbance.  Ears/Nose/Throat/Neck: Denied ear, nose, throat or neck symptoms, hearing loss, nasal discharge, sinus congestion and sore throat.  Cardiovascular: Denied cardiovascular symptoms, arrhythmia, chest pain/pressure, edema, exercise intolerance, orthopnea and palpitations.  Respiratory: Denied pulmonary symptoms, asthma, pleuritic pain, productive sputum, cough, dyspnea and wheezing.  Gastrointestinal: Denied gastro-esophageal reflux, melena, nausea and vomiting.  Genitourinary: Denied genitourinary symptoms including symptomatic vaginal discharge, pelvic relaxation issues, and urinary complaints.  Musculoskeletal: Denied musculoskeletal symptoms, stiffness, swelling, muscle weakness and myalgia.  Dermatologic: Denied dermatology symptoms, rash and scar.  Neurologic: Denied neurology symptoms,  dizziness, headache, neck pain and syncope.  Psychiatric: Denied psychiatric symptoms, anxiety and depression. Difficulty concentrating  Endocrine: Denied endocrine symptoms including hot flashes and night sweats.    OBJECTIVE  BP 122/80   Pulse 81   Ht 5' 6 (1.676 m)   Wt 179 lb 11.2 oz (81.5 kg)   LMP  09/03/2023 (Exact Date)   BMI 29.00 kg/m    Physical examination General NAD, Conversant  HEENT Atraumatic; Op clear with mmm.  Normo-cephalic. Pupils reactive. Anicteric sclerae  Thyroid /Neck Smooth without nodularity or enlargement. Normal ROM.  Neck Supple.  Skin No rashes, lesions or ulceration. Normal palpated skin turgor. No nodularity.  Breasts: No masses or discharge.  Symmetric.  No axillary adenopathy.  Lungs: Clear to auscultation.No rales or wheezes. Normal Respiratory effort, no retractions.  Heart: NSR.  No murmurs or rubs appreciated. No peripheral edema  Abdomen: Soft.  Non-tender.  No masses.  No HSM. No hernia  Extremities: Moves all appropriately.  Normal ROM for age. No lymphadenopathy.  Neuro: Oriented to PPT.  Normal mood. Normal affect.     Pelvic:   Vulva: Normal appearance.  No lesions.  Vagina: No lesions or abnormalities noted.  Support: Normal pelvic support.  Urethra No masses tenderness or scarring.  Meatus Normal size without lesions or prolapse.  Cervix: Normal appearance.  No lesions. Ectropion. Pap collected  Anus: Normal exam.  No lesions.  Perineum: Normal exam.  No lesions.    ASSESSMENT  1) Annual exam 2) Due for Pap 3) Concerns about ADHD 4) Inability to lose weight  PLAN 1) Physical exam as noted. Discussed healthy lifestyle choices and preventive care. STI testing done. Routine labs done with PCP. 2) Pap collected. F/u based on results. 3) Referral sent to psych for evaluation 4) Schedule visit with Zelda Hummer, CNM for weight management.  Return in one year for annual exam or as needed for concerns.   Anastashia Westerfeld, CNM

## 2023-09-10 NOTE — Patient Instructions (Signed)
 Preventive Care 38-38 Years Old, Female Preventive care refers to lifestyle choices and visits with your health care provider that can promote health and wellness. Preventive care visits are also called wellness exams. What can I expect for my preventive care visit? Counseling During your preventive care visit, your health care provider may ask about your: Medical history, including: Past medical problems. Family medical history. Pregnancy history. Current health, including: Menstrual cycle. Method of birth control. Emotional well-being. Home life and relationship well-being. Sexual activity and sexual health. Lifestyle, including: Alcohol, nicotine or tobacco, and drug use. Access to firearms. Diet, exercise, and sleep habits. Work and work Astronomer. Sunscreen use. Safety issues such as seatbelt and bike helmet use. Physical exam Your health care provider may check your: Height and weight. These may be used to calculate your BMI (body mass index). BMI is a measurement that tells if you are at a healthy weight. Waist circumference. This measures the distance around your waistline. This measurement also tells if you are at a healthy weight and may help predict your risk of certain diseases, such as type 2 diabetes and high blood pressure. Heart rate and blood pressure. Body temperature. Skin for abnormal spots. What immunizations do I need?  Vaccines are usually given at various ages, according to a schedule. Your health care provider will recommend vaccines for you based on your age, medical history, and lifestyle or other factors, such as travel or where you work. What tests do I need? Screening Your health care provider may recommend screening tests for certain conditions. This may include: Pelvic exam and Pap test. Lipid and cholesterol levels. Diabetes screening. This is done by checking your blood sugar (glucose) after you have not eaten for a while (fasting). Hepatitis  B test. Hepatitis C test. HIV (human immunodeficiency virus) test. STI (sexually transmitted infection) testing, if you are at risk. BRCA-related cancer screening. This may be done if you have a family history of breast, ovarian, tubal, or peritoneal cancers. Talk with your health care provider about your test results, treatment options, and if necessary, the need for more tests. Follow these instructions at home: Eating and drinking  Eat a healthy diet that includes fresh fruits and vegetables, whole grains, lean protein, and low-fat dairy products. Take vitamin and mineral supplements as recommended by your health care provider. Do not drink alcohol if: Your health care provider tells you not to drink. You are pregnant, may be pregnant, or are planning to become pregnant. If you drink alcohol: Limit how much you have to 0-1 drink a day. Know how much alcohol is in your drink. In the [place] one drink equals one 12 oz bottle of beer (355 mL), one 5 oz glass of wine (148 mL), or one 1 oz glass of hard liquor (44 mL). Lifestyle Brush your teeth every morning and night with fluoride toothpaste. Floss one time each day. Exercise for at least 30 minutes 5 or more days each week. Do not use any products that contain nicotine or tobacco. These products include cigarettes, chewing tobacco, and vaping devices, such as e-cigarettes. If you need help quitting, ask your health care provider. Do not use drugs. If you are sexually active, practice safe sex. Use a condom or other form of protection to prevent STIs. If you do not wish to become pregnant, use a form of birth control. If you plan to become pregnant, see your health care provider for a prepregnancy visit. Find healthy ways to manage stress, such as: Meditation,  yoga, or listening to music. Journaling. Talking to a trusted person. Spending time with friends and family. Minimize exposure to UV radiation to reduce your risk of skin  cancer. Safety Always wear your seat belt while driving or riding in a vehicle. Do not drive: If you have been drinking alcohol. Do not ride with someone who has been drinking. If you have been using any mind-altering substances or drugs. While texting. When you are tired or distracted. Wear a helmet and other protective equipment during sports activities. If you have firearms in your house, make sure you follow all gun safety procedures. Seek help if you have been physically or sexually abused. What's next? Go to your health care provider once a year for an annual wellness visit. Ask your health care provider how often you should have your eyes and teeth checked. Stay up to date on all vaccines. This information is not intended to replace advice given to you by your health care provider. Make sure you discuss any questions you have with your health care provider. Document Revised: 07/25/2020 Document Reviewed: 07/25/2020 Elsevier Patient Education  2024 Elsevier Inc. Breast Self-Awareness Breast self-awareness is knowing how your breasts look and feel. You need to: Check your breasts on a regular basis. Tell your doctor about any changes. Become familiar with the look and feel of your breasts. This can help you catch a breast problem while it is still small and can be treated. You should do breast self-exams even if you have breast implants. What you need: A mirror. A well-lit room. A pillow or other soft object. How to do a breast self-exam Follow these steps to do a breast self-exam: Look for changes  Take off all the clothes above your waist. Stand in front of a mirror in a room with good lighting. Put your hands down at your sides. Compare your breasts in the mirror. Look for any difference between them, such as: A difference in shape. A difference in size. Wrinkles, dips, and bumps in one breast and not the other. Look at each breast for changes in the skin, such  as: Redness. Scaly areas. Skin that has gotten thicker. Dimpling. Open sores (ulcers). Look for changes in your nipples, such as: Fluid coming out of a nipple. Fluid around a nipple. Bleeding. Dimpling. Redness. A nipple that looks pushed in (retracted), or that has changed position. Feel for changes Lie on your back. Feel each breast. To do this: Pick a breast to feel. Place a pillow under the shoulder closest to that breast. Put the arm closest to that breast behind your head. Feel the nipple area of that breast using the hand of your other arm. Feel the area with the pads of your three middle fingers by making small circles with your fingers. Use light, medium, and firm pressure. Continue the overlapping circles, moving downward over the breast. Keep making circles with your fingers. Stop when you feel your ribs. Start making circles with your fingers again, this time going upward until you reach your collarbone. Then, make circles outward across your breast and into your armpit area. Squeeze your nipple. Check for discharge and lumps. Repeat these steps to check your other breast. Sit or stand in the tub or shower. With soapy water on your skin, feel each breast the same way you did when you were lying down. Write down what you find Writing down what you find can help you remember what to tell your doctor. Write down: What is  normal for each breast. Any changes you find in each breast. These include: The kind of changes you find. A tender or painful breast. Any lump you find. Write down its size and where it is. When you last had your monthly period (menstrual cycle). General tips If you are breastfeeding, the best time to check your breasts is after you feed your baby or after you use a breast pump. If you get monthly bleeding, the best time to check your breasts is 5-7 days after your monthly cycle ends. With time, you will become comfortable with the self-exam. You will  also start to know if there are changes in your breasts. Contact a doctor if: You see a change in the shape or size of your breasts or nipples. You see a change in the skin of your breast or nipples, such as red or scaly skin. You have fluid coming from your nipples that is not normal. You find a new lump or thick area. You have breast pain. You have any concerns about your breast health. Summary Breast self-awareness includes looking for changes in your breasts and feeling for changes within your breasts. You should do breast self-awareness in front of a mirror in a well-lit room. If you get monthly periods (menstrual cycles), the best time to check your breasts is 5-7 days after your period ends. Tell your doctor about any changes you see in your breasts. Changes include changes in size, changes on the skin, painful or tender breasts, or fluid from your nipples that is not normal. This information is not intended to replace advice given to you by your health care provider. Make sure you discuss any questions you have with your health care provider. Document Revised: 07/04/2021 Document Reviewed: 11/29/2020 Elsevier Patient Education  2024 [employer].

## 2023-09-11 ENCOUNTER — Ambulatory Visit (INDEPENDENT_AMBULATORY_CARE_PROVIDER_SITE_OTHER): Admitting: Obstetrics

## 2023-09-11 ENCOUNTER — Other Ambulatory Visit (HOSPITAL_COMMUNITY)
Admission: RE | Admit: 2023-09-11 | Discharge: 2023-09-11 | Disposition: A | Discharge status: Home / Self Care | Source: Ambulatory Visit | Attending: Obstetrics | Admitting: Obstetrics

## 2023-09-11 VITALS — BP 122/80 | HR 81 | Ht 66.0 in | Wt 179.7 lb

## 2023-09-11 DIAGNOSIS — Z124 Encounter for screening for malignant neoplasm of cervix: Secondary | ICD-10-CM

## 2023-09-11 DIAGNOSIS — Z01419 Encounter for gynecological examination (general) (routine) without abnormal findings: Secondary | ICD-10-CM

## 2023-09-11 DIAGNOSIS — F909 Attention-deficit hyperactivity disorder, unspecified type: Secondary | ICD-10-CM | POA: Diagnosis not present

## 2023-09-11 DIAGNOSIS — Z113 Encounter for screening for infections with a predominantly sexual mode of transmission: Secondary | ICD-10-CM

## 2023-09-12 LAB — HEP, RPR, HIV PANEL
HIV Screen 4th Generation wRfx: NONREACTIVE
Hepatitis B Surface Ag: NEGATIVE
RPR Ser Ql: NONREACTIVE

## 2023-09-16 LAB — CYTOLOGY - PAP
Chlamydia: NEGATIVE
Comment: NEGATIVE
Comment: NEGATIVE
Comment: NEGATIVE
Comment: NORMAL
Diagnosis: NEGATIVE
High risk HPV: NEGATIVE
Neisseria Gonorrhea: NEGATIVE
Trichomonas: NEGATIVE

## 2023-09-19 ENCOUNTER — Ambulatory Visit: Payer: Self-pay | Admitting: Obstetrics

## 2023-10-19 ENCOUNTER — Ambulatory Visit: Admitting: Family Medicine

## 2023-10-19 ENCOUNTER — Encounter: Payer: Self-pay | Admitting: Family Medicine

## 2023-10-19 VITALS — BP 103/79 | HR 77 | Temp 98.4°F | Ht 66.0 in | Wt 185.6 lb

## 2023-10-19 DIAGNOSIS — E559 Vitamin D deficiency, unspecified: Secondary | ICD-10-CM

## 2023-10-19 DIAGNOSIS — R635 Abnormal weight gain: Secondary | ICD-10-CM

## 2023-10-19 DIAGNOSIS — R41840 Attention and concentration deficit: Secondary | ICD-10-CM | POA: Diagnosis not present

## 2023-10-19 DIAGNOSIS — Z23 Encounter for immunization: Secondary | ICD-10-CM

## 2023-10-19 NOTE — Patient Instructions (Signed)
 [facility]:  [address]?[place], [place] [address] Phone: [phone] Fax: [phone] [email or web address]

## 2023-10-19 NOTE — Progress Notes (Addendum)
 Established patient visit   Patient: Anne Murphy   DOB: September 05, 1985   38 y.o. Female  MRN: [id number] Visit Date: 10/19/2023  Today's healthcare provider: Rockie Agent, MD   Chief Complaint  Patient presents with   Medical Management of Chronic Issues    Patient presents for follow up and vaccinations (HPV, Hep B and flu)    Subjective     HPI     Medical Management of Chronic Issues    Additional comments: Patient presents for follow up and vaccinations (HPV, Hep B and flu)       Last edited by Cherry Chiquita HERO, CMA on 10/19/2023  1:26 PM.       Discussed the use of AI scribe software for clinical note transcription with the patient, who gave verbal consent to proceed.  History of Present Illness Anne Murphy is a 38 year old female who presents for her second dose of hepatitis B vaccine, second dose of HPV vaccine, and seasonal influenza vaccine.  Her vitamin D  levels have improved with over-the-counter supplementation of 2000 units daily.  She is concerned about weight gain, noting an increase since her last visit. Her diet is generally balanced, including protein yogurt with granola and fruit for breakfast, and either a [place] sandwich or protein pasta with vegetables for lunch. She attributes some weight gain to her rotating day-night work schedule, which has affected her eating patterns. Despite moderate exercise, including full-body strength training at the gym three to four times a week, she feels at a loss regarding her weight management.  She feels 'scatterbrained' and overwhelmed, suspecting she may have ADHD. She describes difficulty completing tasks due to being easily distracted by other responsibilities. She has not been formally diagnosed with ADHD but notes that both her children and possibly her husband have it. She has sought a referral for ADHD evaluation but encountered a referral to a facility that only manages, not diagnoses,  ADHD.     Past Medical History:  Diagnosis Date   Thyroid  disease    I see Dr. Cherilyn at [facility] - Endocrinology and Diabetes   Vertigo     Medications: Outpatient Medications Prior to Visit  Medication Sig   Cholecalciferol (VITAMIN D3) 125 MCG (5000 UT) CAPS Take 1 capsule (5,000 Units total) by mouth daily.   Multiple Vitamins-Calcium (ONE-A-DAY WOMENS FORMULA) TABS Take by mouth.   No facility-administered medications prior to visit.    Review of Systems  Last metabolic panel Lab Results  Component Value Date   GLUCOSE 97 09/01/2023   NA 139 09/01/2023   K 4.1 09/01/2023   CL 104 09/01/2023   CO2 23 09/01/2023   BUN 12 09/01/2023   CREATININE 0.87 09/01/2023   EGFR 87 09/01/2023   CALCIUM 9.5 09/01/2023   PROT 7.0 09/01/2023   ALBUMIN 4.3 09/01/2023   LABGLOB 2.7 09/01/2023   AGRATIO 1.6 05/27/2022   BILITOT 0.2 09/01/2023   ALKPHOS 72 09/01/2023   AST 17 09/01/2023   ALT 16 09/01/2023   ANIONGAP 7 01/16/2016   Last lipids Lab Results  Component Value Date   CHOL 168 09/01/2023   HDL 57 09/01/2023   LDLCALC 94 09/01/2023   TRIG 92 09/01/2023   CHOLHDL 2.9 09/01/2023   Last hemoglobin A1c Lab Results  Component Value Date   HGBA1C 5.3 09/01/2023   Last thyroid  functions Lab Results  Component Value Date   TSH 0.592 05/27/2022  Objective    BP 103/79 (BP Location: Left Arm, Patient Position: Sitting, Cuff Size: Normal)   Pulse 77   Temp 98.4 F (36.9 C) (Oral)   Ht 5' 6 (1.676 m)   Wt 185 lb 9.6 oz (84.2 kg)   LMP 09/29/2023   SpO2 100%   BMI 29.96 kg/m  BP Readings from Last 3 Encounters:  10/30/23 125/83  10/19/23 103/79  09/11/23 122/80   Wt Readings from Last 3 Encounters:  10/30/23 183 lb 11.2 oz (83.3 kg)  10/19/23 185 lb 9.6 oz (84.2 kg)  09/11/23 179 lb 11.2 oz (81.5 kg)        Physical Exam  Physical Exam GENERAL: Alert, well developed, well nourished, no acute distress. CHEST: Clear to  auscultation bilaterally, no wheezes. CARDIOVASCULAR: Regular rate and rhythm, S1 and S2 normal without murmurs.    No results found for any visits on 10/19/23.  Assessment & Plan     Problem List Items Addressed This Visit     Attention deficit   Avitaminosis D - Primary   Vitamin D  deficiency Chronic Vitamin D  levels have improved with current supplementation. - Continue over-the-counter vitamin D  2000 units daily.      Other Visit Diagnoses       Immunization due       Relevant Orders   Flu vaccine trivalent PF, 6mos and older(Flulaval,Afluria,Fluarix,Fluzone) (Completed)   HPV 9-valent vaccine,Recombinat (Completed)   Heplisav-B  (HepB-CPG) Vaccine (Completed)     Need for HPV vaccination         Need for hepatitis B vaccination         Weight gain           Assessment and Plan Assessment & Plan   Abnormal weight gain Persistent weight gain despite moderate exercise and a balanced diet. Potential factors include caloric intake, exercise duration, and possible metabolic rate issues. - Encourage increasing exercise to 200-240 minutes per week. - Calculate calorie deficit based on goal weight of 130 lbs, aiming for 1560 calories daily with a protein intake of 85 grams per day. - Follow up with weight loss specialist on October 30, 2023.  Suspected attention deficit disorder Chronic Reports symptoms consistent with attention deficit disorder, including difficulty concentrating and completing tasks. No formal diagnosis in childhood, but family history is suggestive. - Refer to [facility] for formal evaluation and diagnosis of ADHD.  General Health Maintenance Due for vaccinations including hepatitis B, HPV, and influenza. - Administered second dose of hepatitis B vaccine. - Administered second dose of HPV vaccine. - Administered seasonal influenza vaccine.     Return if symptoms worsen or fail to improve.         Rockie Agent, MD  [facility] [phone] (phone) [phone] (fax)  [facility]

## 2023-10-29 NOTE — Progress Notes (Unsigned)
 Subjective:  Anne Murphy is a 38 y.o. G2P2002 at Unknown being seen today for weight loss management- initial visit.  Patient reports General ROS: {[id number]} and reports previous weight loss attempts: Management changes made at the last visit include:  adding medication, stopping medication, changing medication dose and ordering test(s).  Onset was sudden/gradua,  months/year(s) ago.  Onset followed:  starting medication, change in living environment, change in affect, recent pregnancy, and mental status changes. Associated symptoms include: fatigue, depression, anxiety, greasy stools, abdominal pain, polydipsia, headaches, change in clothing fit and menstrual changes. Previous/Current treatment includes: small frequent feedings, nutritional supplement, vitamin supplement, gluten free diet, psychiatrist care, antidepressant, vitamin B-12 injections and appetite stimulant.  Pertinent medical history includes: chronic digestive disease, diabetes, eating disorder, anxiety and psychiatric illness.  Risk factors include: social isolation, poverty, alcoholism, illicit drug use, excessive exercise, poor dentition and new medication.  The patient has a surgical history of: thyroidectomy, gastrectomy, bariatric surgery, cholecystectomy and hysterectomy.  Pertinent social history includes: alcohol abuse, tobacco abuse and marijuana use. Past evaluation has included: metabolic profile, hemoglobin A1c, thyroid  panel  and psychiatric evaluation.  Past treatment has included: small frequent feedings, nutritional supplement, vitamin supplement, social assistance, psychiatrist care, antidepressant, vitamin B-12 injections, appetite stimulant, exercise management and discontinuation of medication.  The following portions of the patient's history were reviewed and updated as appropriate: allergies, current medications, past family history, past medical history, past social history, past surgical  history and problem list.   Objective:  There were no vitals filed for this visit.  General:  Alert, oriented and cooperative. Patient is in no acute distress.  :   :   :   :   :   :   PE: Well groomed female in no current distress,   Mental Status: Normal mood and affect. Normal behavior. Normal judgment and thought content.   Current BMI: There is no height or weight on file to calculate BMI.   Assessment and Plan:  Obesity  There are no diagnoses linked to this encounter.  Plan: low carb, High protein diet RX for adipex 37.5 mg daily and B12 1000mcg.ml monthly, to start now with first injection given at today's visit. Reviewed side-effects common to both medications and expected outcomes. Increase daily water intake to at least 8 bottle a day, every day.  Goal is to reduse weight by 10% by end of three months, and will re-evaluate then.  RTC in 4 weeks for Nurse visit to check weight & BP, and get next B12 injections.    Please refer to After Visit Summary for other counseling recommendations.    Anne Murphy, Anne Murphy   Anne Murphy, CNM      Consider the Low Glycemic Index Diet and 6 smaller meals daily .  This boosts your metabolism and regulates your sugars:   Use the protein bar by [employer]kins because they have lots of fiber in them  Find the low carb flatbreads, tortillas and pita breads for sandwiches:  Joseph's makes a pita bread and a flat bread , available at [facility] and [employer]; Toufayah makes a low carb flatbread available at [employer] and [employer] that is 9 net carbs and 100 cal [place] makes a low carb whole wheat tortilla available at [employer] most grocery stores with 6 net carbs and 210 cal  [place] yogurt can still have a lot of carbs .  Dannon Light N fit has 80 cal and 8 carbs

## 2023-10-30 ENCOUNTER — Ambulatory Visit: Admitting: Certified Nurse Midwife

## 2023-10-30 ENCOUNTER — Encounter: Payer: Self-pay | Admitting: Certified Nurse Midwife

## 2023-10-30 VITALS — BP 125/83 | HR 102 | Ht 66.0 in | Wt 183.7 lb

## 2023-10-30 DIAGNOSIS — R634 Abnormal weight loss: Secondary | ICD-10-CM | POA: Diagnosis not present

## 2023-10-30 DIAGNOSIS — Z7689 Persons encountering health services in other specified circumstances: Secondary | ICD-10-CM | POA: Diagnosis not present

## 2023-10-30 DIAGNOSIS — Z6829 Body mass index (BMI) 29.0-29.9, adult: Secondary | ICD-10-CM

## 2023-11-03 DIAGNOSIS — R41840 Attention and concentration deficit: Secondary | ICD-10-CM | POA: Insufficient documentation

## 2023-11-03 NOTE — Assessment & Plan Note (Signed)
 Vitamin D  deficiency Chronic Vitamin D  levels have improved with current supplementation. - Continue over-the-counter vitamin D  2000 units daily.

## 2024-02-18 ENCOUNTER — Ambulatory Visit: Admitting: Family Medicine
